# Patient Record
Sex: Female | Born: 1965 | Race: White | Hispanic: No | Marital: Single | Smoking: Current some day smoker
Health system: Southern US, Community
[De-identification: ages and names within clinical notes are randomized; demographics above are authoritative.]

## PROBLEM LIST (undated history)

## (undated) DIAGNOSIS — Z973 Presence of spectacles and contact lenses: Secondary | ICD-10-CM

## (undated) DIAGNOSIS — N9489 Other specified conditions associated with female genital organs and menstrual cycle: Secondary | ICD-10-CM

## (undated) DIAGNOSIS — I10 Essential (primary) hypertension: Secondary | ICD-10-CM

## (undated) DIAGNOSIS — T8859XA Other complications of anesthesia, initial encounter: Secondary | ICD-10-CM

## (undated) DIAGNOSIS — K5100 Ulcerative (chronic) pancolitis without complications: Secondary | ICD-10-CM

## (undated) DIAGNOSIS — K5790 Diverticulosis of intestine, part unspecified, without perforation or abscess without bleeding: Secondary | ICD-10-CM

## (undated) DIAGNOSIS — K5190 Ulcerative colitis, unspecified, without complications: Secondary | ICD-10-CM

## (undated) DIAGNOSIS — F419 Anxiety disorder, unspecified: Secondary | ICD-10-CM

## (undated) DIAGNOSIS — T7840XA Allergy, unspecified, initial encounter: Secondary | ICD-10-CM

## (undated) DIAGNOSIS — J310 Chronic rhinitis: Secondary | ICD-10-CM

## (undated) DIAGNOSIS — Z9889 Other specified postprocedural states: Secondary | ICD-10-CM

## (undated) DIAGNOSIS — R9389 Abnormal findings on diagnostic imaging of other specified body structures: Secondary | ICD-10-CM

## (undated) DIAGNOSIS — R509 Fever, unspecified: Secondary | ICD-10-CM

## (undated) DIAGNOSIS — G540 Brachial plexus disorders: Secondary | ICD-10-CM

## (undated) HISTORY — PX: BREAST BIOPSY: SHX20

## (undated) HISTORY — DX: Ulcerative colitis, unspecified, without complications: K51.90

## (undated) HISTORY — PX: FIRST RIB REMOVAL: SHX642

## (undated) HISTORY — DX: Allergy, unspecified, initial encounter: T78.40XA

## (undated) HISTORY — DX: Anxiety disorder, unspecified: F41.9

---

## 2003-03-13 ENCOUNTER — Encounter: Payer: Self-pay | Admitting: Obstetrics and Gynecology

## 2003-03-13 ENCOUNTER — Encounter: Admission: RE | Admit: 2003-03-13 | Discharge: 2003-03-13 | Payer: Self-pay | Admitting: Obstetrics and Gynecology

## 2005-10-05 ENCOUNTER — Encounter: Admission: RE | Admit: 2005-10-05 | Discharge: 2005-10-05 | Payer: Self-pay | Admitting: Obstetrics and Gynecology

## 2006-10-19 ENCOUNTER — Encounter: Admission: RE | Admit: 2006-10-19 | Discharge: 2006-10-19 | Payer: Self-pay | Admitting: Obstetrics and Gynecology

## 2008-02-19 ENCOUNTER — Emergency Department (HOSPITAL_COMMUNITY): Admission: EM | Admit: 2008-02-19 | Discharge: 2008-02-19 | Payer: Self-pay | Admitting: Emergency Medicine

## 2008-05-30 ENCOUNTER — Encounter: Admission: RE | Admit: 2008-05-30 | Discharge: 2008-05-30 | Payer: Self-pay | Admitting: Obstetrics and Gynecology

## 2008-09-03 ENCOUNTER — Ambulatory Visit: Payer: Self-pay | Admitting: Vascular Surgery

## 2008-10-11 DIAGNOSIS — Z86718 Personal history of other venous thrombosis and embolism: Secondary | ICD-10-CM

## 2008-10-11 DIAGNOSIS — G540 Brachial plexus disorders: Secondary | ICD-10-CM

## 2008-10-11 HISTORY — DX: Brachial plexus disorders: G54.0

## 2008-10-11 HISTORY — DX: Personal history of other venous thrombosis and embolism: Z86.718

## 2008-12-30 HISTORY — PX: RIB RESECTION: SHX5077

## 2009-01-23 HISTORY — PX: FIRST RIB REMOVAL: SHX642

## 2009-06-23 ENCOUNTER — Encounter: Admission: RE | Admit: 2009-06-23 | Discharge: 2009-06-23 | Payer: Self-pay | Admitting: Obstetrics and Gynecology

## 2010-07-07 ENCOUNTER — Encounter: Admission: RE | Admit: 2010-07-07 | Discharge: 2010-07-07 | Payer: Self-pay | Admitting: Obstetrics and Gynecology

## 2010-11-02 ENCOUNTER — Encounter: Payer: Self-pay | Admitting: Obstetrics and Gynecology

## 2011-02-23 NOTE — Procedures (Signed)
DUPLEX DEEP VENOUS EXAM - UPPER EXTREMITY   INDICATION:  Left arm swelling.   HISTORY:  Edema:  Five months of left arm swelling.  Trauma/Surgery:  No.  Pain:  Left arm tightness.  PE:  No.  Previous DVT:  No.  Anticoagulants:  No.  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  The thrombus extends  just proximal and distal to the clavicle.   ___________________________________________  Rosetta Posner, M.D.   MC/MEDQ  D:  09/03/2008  T:  09/03/2008  Job:  [phone]

## 2011-03-05 ENCOUNTER — Other Ambulatory Visit (HOSPITAL_COMMUNITY)
Admission: RE | Admit: 2011-03-05 | Discharge: 2011-03-05 | Disposition: A | Discharge status: Home / Self Care | Payer: BC Managed Care – PPO | Source: Ambulatory Visit | Attending: Obstetrics and Gynecology | Admitting: Obstetrics and Gynecology

## 2011-03-05 ENCOUNTER — Other Ambulatory Visit: Payer: Self-pay | Admitting: Obstetrics and Gynecology

## 2011-03-05 DIAGNOSIS — Z1231 Encounter for screening mammogram for malignant neoplasm of breast: Secondary | ICD-10-CM

## 2011-03-05 DIAGNOSIS — Z124 Encounter for screening for malignant neoplasm of cervix: Secondary | ICD-10-CM | POA: Insufficient documentation

## 2011-07-09 ENCOUNTER — Ambulatory Visit
Admission: RE | Admit: 2011-07-09 | Discharge: 2011-07-09 | Disposition: A | Discharge status: Home / Self Care | Payer: BC Managed Care – PPO | Source: Ambulatory Visit | Attending: Obstetrics and Gynecology | Admitting: Obstetrics and Gynecology

## 2011-07-09 DIAGNOSIS — Z1231 Encounter for screening mammogram for malignant neoplasm of breast: Secondary | ICD-10-CM

## 2012-09-28 ENCOUNTER — Other Ambulatory Visit: Payer: Self-pay | Admitting: Obstetrics and Gynecology

## 2012-09-28 DIAGNOSIS — Z1231 Encounter for screening mammogram for malignant neoplasm of breast: Secondary | ICD-10-CM

## 2012-10-10 ENCOUNTER — Ambulatory Visit
Admission: RE | Admit: 2012-10-10 | Discharge: 2012-10-10 | Disposition: A | Discharge status: Home / Self Care | Payer: BC Managed Care – PPO | Source: Ambulatory Visit | Attending: Obstetrics and Gynecology | Admitting: Obstetrics and Gynecology

## 2012-10-10 DIAGNOSIS — Z1231 Encounter for screening mammogram for malignant neoplasm of breast: Secondary | ICD-10-CM

## 2012-10-20 ENCOUNTER — Ambulatory Visit: Payer: BC Managed Care – PPO

## 2013-01-15 ENCOUNTER — Emergency Department (INDEPENDENT_AMBULATORY_CARE_PROVIDER_SITE_OTHER)
Admission: EM | Admit: 2013-01-15 | Discharge: 2013-01-15 | Disposition: A | Discharge status: Home / Self Care | Payer: BC Managed Care – PPO | Source: Home / Self Care | Attending: Family Medicine | Admitting: Family Medicine

## 2013-01-15 ENCOUNTER — Encounter (HOSPITAL_COMMUNITY): Payer: Self-pay | Admitting: Emergency Medicine

## 2013-01-15 DIAGNOSIS — J309 Allergic rhinitis, unspecified: Secondary | ICD-10-CM

## 2013-01-15 DIAGNOSIS — R030 Elevated blood-pressure reading, without diagnosis of hypertension: Secondary | ICD-10-CM

## 2013-01-15 HISTORY — DX: Brachial plexus disorders: G54.0

## 2013-01-15 MED ORDER — FLUTICASONE PROPIONATE 50 MCG/ACT NA SUSP: 2.0000 | Freq: Every day | NASAL | Status: DC

## 2013-01-15 NOTE — ED Provider Notes (Signed)
History     CSN: [id number]  Arrival date & time 01/15/13  1734   None     Chief Complaint  Patient presents with  . Recurrent Sinusitis    (Consider location/radiation/quality/duration/timing/severity/associated sxs/prior treatment) HPI Comments: Pt does not feel sick, no fever, chills, myalgias, rather just feels sx, mostly very congested, watery eyes, scratchy throat, clear nasal drainage. Does not have hx htn, but has been taking claritin D, otc cold medicines and using afrin nasal spray.   Patient is a 47 y.o. female presenting with URI. The history is provided by the patient.  URI Presenting symptoms: congestion, cough, ear pain, rhinorrhea and sore throat   Presenting symptoms: no fever   Severity:  Moderate Onset quality:  Gradual Duration:  5 days Timing:  Constant Progression:  Worsening Chronicity:  New Relieved by:  Nothing Worsened by:  Nothing tried Ineffective treatments:  Decongestant and OTC medications Associated symptoms: sneezing   Associated symptoms: no sinus pain, no swollen glands and no wheezing     Past Medical History  Diagnosis Date  . Thoracic outlet syndrome     History reviewed. No pertinent past surgical history.  History reviewed. No pertinent family history.  History  Substance Use Topics  . Smoking status: Never Smoker   . Smokeless tobacco: Not on file  . Alcohol Use: 0.6 oz/week    1 Glasses of wine per week     Comment: occasionally    OB History   Grav Para Term Preterm Abortions TAB SAB Ect Mult Living                  Review of Systems  Constitutional: Negative for fever and chills.  HENT: Positive for ear pain, congestion, sore throat, rhinorrhea, sneezing and postnasal drip. Negative for sinus pressure.        Ears do not hurt, but she feels ear pressure  Respiratory: Positive for cough. Negative for wheezing.     Allergies  Review of patient's allergies indicates no known allergies.  Home Medications    Current Outpatient Rx  Name  Route  Sig  Dispense  Refill  . fluticasone (FLONASE) 50 MCG/ACT nasal spray   Nasal   Place 2 sprays into the nose daily.   16 g   2     BP 183/128  Pulse 95  Temp(Src) 98.3 F (36.8 C) (Oral)  SpO2 100%  LMP 01/08/2013  Physical Exam  Constitutional: She appears well-developed and well-nourished. No distress.  HENT:  Right Ear: Tympanic membrane, external ear and ear canal normal.  Left Ear: Tympanic membrane, external ear and ear canal normal.  Nose: Mucosal edema and rhinorrhea present. Right sinus exhibits no maxillary sinus tenderness and no frontal sinus tenderness. Left sinus exhibits no maxillary sinus tenderness and no frontal sinus tenderness.  Mouth/Throat: Oropharynx is clear and moist and mucous membranes are normal.  Eyes: Right eye exhibits no discharge and no exudate. Left eye exhibits no discharge and no exudate. Right conjunctiva is not injected. Left conjunctiva is not injected.  Cardiovascular: Normal rate and regular rhythm.   Pulmonary/Chest: Effort normal and breath sounds normal.    ED Course  Procedures (including critical care time)  Labs Reviewed - No data to display No results found.   1. Allergic rhinitis   2. Elevated blood pressure reading without diagnosis of hypertension       MDM  Sx most c/w allergic rhinitis.  Rx flonase, pt to stop claritin D and take plain  claritin. Pt to stop cold medicines and monitor bp at home. F/u with pcp if bp higher than 120/80 consistently. Recheck bp at end of visit is 158/116, high most likely from otc meds use.         Carvel Getting, NP 01/15/13 1958

## 2013-01-15 NOTE — ED Notes (Signed)
Pt c/o sinus pressure with congestion, watery eyes, sneezing x 4 days. Has been taking OTC cold medicine and Claritin with mild relief. No fever. Also noticed spots on both wrist also possibly due to allergies. Pt states she has recurrent seasonal allergies. Has used nasal spray as well with mild relief. Pt is alert and oriented.

## 2013-01-17 NOTE — ED Provider Notes (Signed)
Medical screening examination/treatment/procedure(s) were performed by resident physician or non-physician practitioner and as supervising physician I was immediately available for consultation/collaboration.   Pauline Good MD.   Billy Fischer, MD 01/17/13 [phone]

## 2013-01-18 ENCOUNTER — Ambulatory Visit: Payer: BC Managed Care – PPO | Admitting: Emergency Medicine

## 2013-01-18 VITALS — BP 124/76 | HR 76 | Temp 98.2°F | Resp 16 | Ht 66.25 in | Wt 147.4 lb

## 2013-01-18 DIAGNOSIS — J209 Acute bronchitis, unspecified: Secondary | ICD-10-CM

## 2013-01-18 DIAGNOSIS — J309 Allergic rhinitis, unspecified: Secondary | ICD-10-CM

## 2013-01-18 DIAGNOSIS — J018 Other acute sinusitis: Secondary | ICD-10-CM

## 2013-01-18 MED ORDER — HYDROCOD POLST-CHLORPHEN POLST 10-8 MG/5ML PO LQCR: 5.0000 mL | Freq: Two times a day (BID) | ORAL | Status: DC | PRN

## 2013-01-18 MED ORDER — PSEUDOEPHEDRINE-GUAIFENESIN ER 60-600 MG PO TB12: 1.0000 | ORAL_TABLET | Freq: Two times a day (BID) | ORAL | Status: AC

## 2013-01-18 MED ORDER — AMOXICILLIN-POT CLAVULANATE 875-125 MG PO TABS: 1.0000 | ORAL_TABLET | Freq: Two times a day (BID) | ORAL | Status: DC

## 2013-01-18 MED ORDER — MOMETASONE FUROATE 50 MCG/ACT NA SUSP: 4.0000 | Freq: Every day | NASAL | Status: DC

## 2013-01-18 NOTE — Progress Notes (Signed)
[facility] [address], [place] [address] [phone]  Date:  01/18/2013   Name:  Jill Roman   DOB:  [birth year]   MRN:  [id number]  PCP:  No primary provider on file.    Chief Complaint: Sore Throat   History of Present Illness:  [name] is a 47 y.o. very pleasant female patient who presents with the following:  Ill for past week after doing yard work.  Started with allergic symptoms and was treated at [facility].  Now worse.  Has pressure in cheeks and purulent drainage.  Clearing throat.  Non productive cough.  No chills but fever this week at night several nights.  No wheezing or shortness of breath.  No improvement with over the counter medications or other home remedies. Denies other complaint or health concern today.   There is no problem list on file for this patient.   Past Medical History  Diagnosis Date  . Thoracic outlet syndrome   . Allergy     No past surgical history on file.  History  Substance Use Topics  . Smoking status: Never Smoker   . Smokeless tobacco: Not on file  . Alcohol Use: 0.6 oz/week    1 Glasses of wine per week     Comment: occasionally    Family History  Problem Relation Age of Onset  . Heart disease Mother   . Heart disease Father     No Known Allergies  Medication list has been reviewed and updated.  Current Outpatient Prescriptions on File Prior to Visit  Medication Sig Dispense Refill  . fluticasone (FLONASE) 50 MCG/ACT nasal spray Place 2 sprays into the nose daily.  16 g  2   No current facility-administered medications on file prior to visit.    Review of Systems:  As per HPI, otherwise negative.    Physical Examination: Filed Vitals:   01/18/13 1059  BP: 124/76  Pulse: 76  Temp: 98.2 F (36.8 C)  Resp: 16   Filed Vitals:   01/18/13 1059  Height: 5' 6.25" (1.683 m)  Weight: 147 lb 6.4 oz (66.86 kg)   Body mass index is 23.6 kg/(m^2). Ideal Body Weight: Weight in (lb) to  have BMI = 25: 155.7  GEN: WDWN, NAD, Non-toxic, A & O x 3 HEENT: Atraumatic, Normocephalic. Neck supple. No masses, No LAD. Ears and Nose: No external deformity. CV: RRR, No M/G/R. No JVD. No thrill. No extra heart sounds. PULM: CTA B, no wheezes, crackles, rhonchi. No retractions. No resp. distress. No accessory muscle use. ABD: S, NT, ND, +BS. No rebound. No HSM. EXTR: No c/c/e NEURO Normal gait.  PSYCH: Normally interactive. Conversant. Not depressed or anxious appearing.  Calm demeanor.    Assessment and Plan: Seasonal allergic rhinitis augmentin mucinex d tussionex nasonex   Signed,  Ellison Carwin, MD

## 2013-01-18 NOTE — Patient Instructions (Addendum)
Sinusitis Sinusitis is redness, soreness, and swelling (inflammation) of the paranasal sinuses. Paranasal sinuses are air pockets within the bones of your face (beneath the eyes, the middle of the forehead, or above the eyes). In healthy paranasal sinuses, mucus is able to drain out, and air is able to circulate through them by way of your nose. However, when your paranasal sinuses are inflamed, mucus and air can become trapped. This can allow bacteria and other germs to grow and cause infection. Sinusitis can develop quickly and last only a short time (acute) or continue over a long period (chronic). Sinusitis that lasts for more than 12 weeks is considered chronic.  CAUSES  Causes of sinusitis include:  Allergies.  Structural abnormalities, such as displacement of the cartilage that separates your nostrils (deviated septum), which can decrease the air flow through your nose and sinuses and affect sinus drainage.  Functional abnormalities, such as when the small hairs (cilia) that line your sinuses and help remove mucus do not work properly or are not present. SYMPTOMS  Symptoms of acute and chronic sinusitis are the same. The primary symptoms are pain and pressure around the affected sinuses. Other symptoms include:  Upper toothache.  Earache.  Headache.  Bad breath.  Decreased sense of smell and taste.  A cough, which worsens when you are lying flat.  Fatigue.  Fever.  Thick drainage from your nose, which often is green and may contain pus (purulent).  Swelling and warmth over the affected sinuses. DIAGNOSIS  Your caregiver will perform a physical exam. During the exam, your caregiver may:  Look in your nose for signs of abnormal growths in your nostrils (nasal polyps).  Tap over the affected sinus to check for signs of infection.  View the inside of your sinuses (endoscopy) with a special imaging device with a light attached (endoscope), which is inserted into your  sinuses. If your caregiver suspects that you have chronic sinusitis, one or more of the following tests may be recommended:  Allergy tests.  Nasal culture A sample of mucus is taken from your nose and sent to a lab and screened for bacteria.  Nasal cytology A sample of mucus is taken from your nose and examined by your caregiver to determine if your sinusitis is related to an allergy. TREATMENT  Most cases of acute sinusitis are related to a viral infection and will resolve on their own within 10 days. Sometimes medicines are prescribed to help relieve symptoms (pain medicine, decongestants, nasal steroid sprays, or saline sprays).  However, for sinusitis related to a bacterial infection, your caregiver will prescribe antibiotic medicines. These are medicines that will help kill the bacteria causing the infection.  Rarely, sinusitis is caused by a fungal infection. In theses cases, your caregiver will prescribe antifungal medicine. For some cases of chronic sinusitis, surgery is needed. Generally, these are cases in which sinusitis recurs more than 3 times per year, despite other treatments. HOME CARE INSTRUCTIONS   Drink plenty of water. Water helps thin the mucus so your sinuses can drain more easily.  Use a humidifier.  Inhale steam 3 to 4 times a day (for example, sit in the bathroom with the shower running).  Apply a warm, moist washcloth to your face 3 to 4 times a day, or as directed by your caregiver.  Use saline nasal sprays to help moisten and clean your sinuses.  Take over-the-counter or prescription medicines for pain, discomfort, or fever only as directed by your caregiver. [place]  CARE IF:  You have increasing pain or severe headaches.  You have nausea, vomiting, or drowsiness.  You have swelling around your face.  You have vision problems.  You have a stiff neck.  You have difficulty breathing. MAKE SURE YOU:   Understand these  instructions.  Will watch your condition.  Will get help right away if you are not doing well or get worse. Document Released: 09/27/2005 Document Revised: 12/20/2011 Document Reviewed: 10/12/2011 [facility] Patient Information 2013 [place]. Allergic Rhinitis Allergic rhinitis is when the mucous membranes in the nose respond to allergens. Allergens are particles in the air that cause your body to have an allergic reaction. This causes you to release allergic antibodies. Through a chain of events, these eventually cause you to release histamine into the blood stream (hence the use of antihistamines). Although meant to be protective to the body, it is this release that causes your discomfort, such as frequent sneezing, congestion and an itchy runny nose.  CAUSES  The pollen allergens may come from grasses, trees, and weeds. This is seasonal allergic rhinitis, or "hay fever." Other allergens cause year-round allergic rhinitis (perennial allergic rhinitis) such as house dust mite allergen, pet dander and mold spores.  SYMPTOMS   Nasal stuffiness (congestion).  Runny, itchy nose with sneezing and tearing of the eyes.  There is often an itching of the mouth, eyes and ears. It cannot be cured, but it can be controlled with medications. DIAGNOSIS  If you are unable to determine the offending allergen, skin or blood testing may find it. TREATMENT   Avoid the allergen.  Medications and allergy shots (immunotherapy) can help.  Hay fever may often be treated with antihistamines in pill or nasal spray forms. Antihistamines block the effects of histamine. There are over-the-counter medicines that may help with nasal congestion and swelling around the eyes. Check with your caregiver before taking or giving this medicine. If the treatment above does not work, there are many new medications your caregiver can prescribe. Stronger medications may be used if initial measures are ineffective.  Desensitizing injections can be used if medications and avoidance fails. Desensitization is when a patient is given ongoing shots until the body becomes less sensitive to the allergen. Make sure you follow up with your caregiver if problems continue. SEEK MEDICAL CARE IF:   You develop fever (more than 100.5 F (38.1 C).  You develop a cough that does not stop easily (persistent).  You have shortness of breath.  You start wheezing.  Symptoms interfere with normal daily activities. Document Released: 06/22/2001 Document Revised: 12/20/2011 Document Reviewed: 01/01/2009 [facility] Patient Information 2013 [place].

## 2013-02-21 ENCOUNTER — Other Ambulatory Visit: Payer: Self-pay | Admitting: Emergency Medicine

## 2013-02-21 NOTE — Telephone Encounter (Signed)
Dr Ouida Sills, do you want to RF?

## 2013-02-21 NOTE — Telephone Encounter (Signed)
No, if they need it they should be reseen

## 2013-10-08 ENCOUNTER — Other Ambulatory Visit: Payer: Self-pay

## 2013-10-08 DIAGNOSIS — Z1231 Encounter for screening mammogram for malignant neoplasm of breast: Secondary | ICD-10-CM

## 2013-12-25 ENCOUNTER — Telehealth: Payer: Self-pay

## 2013-12-25 NOTE — Telephone Encounter (Signed)
PA was denied for nasonex because pt has not tried/failed preferred generic Rhinocort. Dr Ouida Sills, do you want to Rx the Rhinocort? I have pended for 1 mos only bc pt is due for f/up next month. Please review sig.

## 2013-12-27 MED ORDER — BUDESONIDE 32 MCG/ACT NA SUSP: 2.0000 | Freq: Every day | NASAL | Status: DC

## 2013-12-27 NOTE — Telephone Encounter (Signed)
Sent Rhinocort and LM for pharmacy that sent in replacement for nasonex not covered by ins.

## 2013-12-27 NOTE — Telephone Encounter (Signed)
Just send her the rhinocort plwease

## 2014-03-29 ENCOUNTER — Other Ambulatory Visit: Payer: Self-pay | Admitting: Emergency Medicine

## 2014-09-13 ENCOUNTER — Other Ambulatory Visit: Payer: Self-pay

## 2014-09-13 DIAGNOSIS — Z1231 Encounter for screening mammogram for malignant neoplasm of breast: Secondary | ICD-10-CM

## 2014-10-03 ENCOUNTER — Ambulatory Visit
Admission: RE | Admit: 2014-10-03 | Discharge: 2014-10-03 | Disposition: A | Discharge status: Home / Self Care | Payer: BC Managed Care – PPO | Source: Ambulatory Visit

## 2014-10-03 DIAGNOSIS — Z1231 Encounter for screening mammogram for malignant neoplasm of breast: Secondary | ICD-10-CM

## 2014-10-30 ENCOUNTER — Encounter: Payer: Self-pay | Admitting: Certified Nurse Midwife

## 2014-10-30 ENCOUNTER — Ambulatory Visit (INDEPENDENT_AMBULATORY_CARE_PROVIDER_SITE_OTHER): Payer: BLUE CROSS/BLUE SHIELD | Admitting: Certified Nurse Midwife

## 2014-10-30 VITALS — BP 126/84 | HR 72 | Resp 16 | Ht 66.25 in | Wt 155.0 lb

## 2014-10-30 DIAGNOSIS — Z23 Encounter for immunization: Secondary | ICD-10-CM

## 2014-10-30 DIAGNOSIS — Z01419 Encounter for gynecological examination (general) (routine) without abnormal findings: Secondary | ICD-10-CM

## 2014-10-30 DIAGNOSIS — Z0000 Encounter for general adult medical examination without abnormal findings: Secondary | ICD-10-CM

## 2014-10-30 NOTE — Patient Instructions (Signed)

## 2014-10-30 NOTE — Progress Notes (Signed)
49 y.o.  Single  Caucasian Jill Roman here to establish gyn care and for annual exam.  Periods normal. Patient had thoracic outlet syndrome with rib removal  4 years ago and no issues. Sees Dr. Sonia Baller as PCP prn. Desires fasting labs. Periods normal, no issues. Patient has an area on vulva she would like to have looked at. Noted several months ago. Patient noted that it may be from exercise.. No other health issues.  Patient's last menstrual period was 10/23/2014.          Sexually active: No.  The current method of family planning is abstinence.    Exercising: Yes.    eliptical, tennis,spin & cardio Smoker:  no  Health Maintenance: Pap:  2013 neg, no abnormals MMG:  10-03-14 category c, birads 1 negative discussed  3 D mammogram yearly Colonoscopy:  2003 negative for blood in your stool BMD:   none TDaP:  Unsure greater than 10 years Labs: none Self breast exam: done occ   reports that she has quit smoking. She does not have any smokeless tobacco history on file. She reports that she drinks alcohol. She reports that she does not use illicit drugs.  Past Medical History  Diagnosis Date  . Thoracic outlet syndrome   . Allergy   . Anxiety     in the past    Past Surgical History  Procedure Laterality Date  . First rib removal      for thoracic outlet syndrome    Current Outpatient Prescriptions  Medication Sig Dispense Refill  . cetirizine (ZYRTEC) 10 MG tablet Take 10 mg by mouth daily.     No current facility-administered medications for this visit.    Family History  Problem Relation Age of Onset  . Heart disease Mother   . Depression Mother   . Anxiety disorder Mother   . Heart disease Father   . Hypertension Father   . Heart attack Father   . Breast cancer Maternal Grandmother   . Heart attack Maternal Grandmother     ROS:  Pertinent items are noted in HPI.  Otherwise, a comprehensive ROS was negative.  Exam:   BP 126/84 mmHg  Pulse 72  Resp 16  Ht 5' 6.25"  (1.683 m)  Wt 155 lb (70.308 kg)  BMI 24.82 kg/m2  LMP 10/23/2014 Height: 5' 6.25" (168.3 cm) Ht Readings from Last 3 Encounters:  10/30/14 5' 6.25" (1.683 m)  01/18/13 5' 6.25" (1.683 m)    General appearance: alert, cooperative and appears stated age Head: Normocephalic, without obvious abnormality, atraumatic Neck: no adenopathy, supple, symmetrical, trachea midline and thyroid normal to inspection and palpation Lungs: clear to auscultation bilaterally Breasts: normal appearance, no masses or tenderness, No nipple retraction or dimpling, No nipple discharge or bleeding, No axillary or supraclavicular adenopathy Heart: regular rate and rhythm Abdomen: soft, non-tender; no masses,  no organomegaly Extremities: extremities normal, atraumatic, no cyanosis or edema Skin: Skin color, texture, turgor normal. No rashes or lesions Lymph nodes: Cervical, supraclavicular, and axillary nodes normal. No abnormal inguinal nodes palpated Neurologic: Grossly normal   Pelvic: External genitalia:  no lesions, 2 inch long skin tag noted on mid lower mons pubic area               Urethra:  normal appearing urethra with no masses, tenderness or lesions              Bartholin's and Skene's: normal  Vagina: normal appearing vagina with normal color and discharge, no lesions              Cervix: normal, non tender, no lesions              Pap taken: Yes.   Bimanual Exam:  Uterus:  normal size, contour, position, consistency, mobility, non-tender              Adnexa: normal adnexa and no mass, fullness, tenderness               Rectovaginal: Confirms               Anus:  normal sphincter tone, no lesions  Chaperone present: Yes  A:  Well Woman with normal exam  Contraception none needed, not sexually active ever  Screening labs patient will schedule  Vulva/mons area skin tag desires removal  Immunization update  P:   Reviewed health and wellness pertinent to exam  Patient will  fast and come in for labs  Lab CMP,Lipid panel, CBC, TSH,Vit. D  Discussed agree appears to be skin tag and will discuss with other provider and advise and will be schedule  Requests TDAP  Pap smear taken today with HPVHR   counseled on breast self exam, mammography screening, adequate intake of calcium and vitamin D, diet and exercise  return annually or prn  An After Visit Summary was printed and given to the patient.

## 2014-11-01 LAB — IPS PAP TEST WITH HPV

## 2014-11-03 NOTE — Progress Notes (Signed)
Reviewed personally.  M. Suzanne Jamen Loiseau, MD.  

## 2014-11-07 ENCOUNTER — Other Ambulatory Visit (INDEPENDENT_AMBULATORY_CARE_PROVIDER_SITE_OTHER): Payer: BLUE CROSS/BLUE SHIELD

## 2014-11-07 DIAGNOSIS — Z0000 Encounter for general adult medical examination without abnormal findings: Secondary | ICD-10-CM

## 2014-11-07 LAB — CBC
HCT: 39.4 % (ref 36.0–46.0)
Hemoglobin: 13.1 g/dL (ref 12.0–15.0)
MCH: 29.5 pg (ref 26.0–34.0)
MCHC: 33.2 g/dL (ref 30.0–36.0)
MCV: 88.7 fL (ref 78.0–100.0)
MPV: 10.2 fL (ref 8.6–12.4)
Platelets: 309 10*3/uL (ref 150–400)
RBC: 4.44 MIL/uL (ref 3.87–5.11)
RDW: 14.3 % (ref 11.5–15.5)
WBC: 6.8 10*3/uL (ref 4.0–10.5)

## 2014-11-07 LAB — TSH: TSH: 0.948 u[IU]/mL (ref 0.350–4.500)

## 2014-11-07 LAB — COMPREHENSIVE METABOLIC PANEL
ALT: 21 U/L (ref 0–35)
AST: 18 U/L (ref 0–37)
Albumin: 4.1 g/dL (ref 3.5–5.2)
Alkaline Phosphatase: 43 U/L (ref 39–117)
BUN: 16 mg/dL (ref 6–23)
CO2: 26 mEq/L (ref 19–32)
Calcium: 9.1 mg/dL (ref 8.4–10.5)
Chloride: 105 mEq/L (ref 96–112)
Creat: 0.67 mg/dL (ref 0.50–1.10)
Glucose, Bld: 76 mg/dL (ref 70–99)
Potassium: 4.7 mEq/L (ref 3.5–5.3)
Sodium: 138 mEq/L (ref 135–145)
Total Bilirubin: 0.5 mg/dL (ref 0.2–1.2)
Total Protein: 6.2 g/dL (ref 6.0–8.3)

## 2014-11-07 LAB — LIPID PANEL
Cholesterol: 186 mg/dL (ref 0–200)
HDL: 77 mg/dL (ref >39)
LDL Cholesterol: 102 mg/dL — ABNORMAL HIGH (ref 0–99)
Total CHOL/HDL Ratio: 2.4 Ratio
Triglycerides: 36 mg/dL (ref <150)
VLDL: 7 mg/dL (ref 0–40)

## 2014-11-08 LAB — VITAMIN D 25 HYDROXY (VIT D DEFICIENCY, FRACTURES): Vit D, 25-Hydroxy: 18 ng/mL — ABNORMAL LOW (ref 30–100)

## 2014-11-12 ENCOUNTER — Other Ambulatory Visit: Payer: Self-pay

## 2014-11-12 MED ORDER — VITAMIN D (ERGOCALCIFEROL) 1.25 MG (50000 UNIT) PO CAPS: 50000.0000 [IU] | ORAL_CAPSULE | ORAL | Status: DC

## 2014-11-12 NOTE — Telephone Encounter (Signed)
Per lab rx sent in for vitamin d. See vitamin d labwork

## 2015-01-10 ENCOUNTER — Telehealth: Payer: Self-pay | Admitting: Certified Nurse Midwife

## 2015-01-10 NOTE — Telephone Encounter (Signed)
She will need visit with Dr Sabra Heck to assess

## 2015-01-10 NOTE — Telephone Encounter (Signed)
Spoke with patient. Patient states she has completed taking Vitamin D 50,000 IU weekly for two weeks and can not remember dosage to take now. Advised patient to begin taking Vitamin D3 800 IU daily for 2 months. Patient is agreeable. Has Vitamin D recheck scheduled for 03/14/2015. Patient also inquiring about skin tag removal with our office. Advised will speak with Regina Eck CNM regarding removal and return call with further information.  Regina Eck CNM okay for skin tag removal in office?

## 2015-01-10 NOTE — Telephone Encounter (Signed)
Spoke with patient. Advised of message as seen below from [place]. Patient is agreeable. Appointment scheduled with Dr.Miller on 4/11 at [place]. Patient is agreeable to date and time.  Cc: Dr.Miller  Routing to provider for final review. Patient agreeable to disposition. Will close encounter

## 2015-01-10 NOTE — Telephone Encounter (Signed)
Patient calling to review vitamin D instructions.

## 2015-01-20 ENCOUNTER — Other Ambulatory Visit: Payer: Self-pay | Admitting: Obstetrics & Gynecology

## 2015-01-20 ENCOUNTER — Ambulatory Visit (INDEPENDENT_AMBULATORY_CARE_PROVIDER_SITE_OTHER): Payer: BLUE CROSS/BLUE SHIELD | Admitting: Obstetrics & Gynecology

## 2015-01-20 ENCOUNTER — Ambulatory Visit: Payer: BLUE CROSS/BLUE SHIELD | Admitting: Obstetrics & Gynecology

## 2015-01-20 VITALS — BP 140/82 | HR 60 | Resp 16 | Wt 157.0 lb

## 2015-01-20 DIAGNOSIS — L918 Other hypertrophic disorders of the skin: Secondary | ICD-10-CM | POA: Diagnosis not present

## 2015-01-20 DIAGNOSIS — N9089 Other specified noninflammatory disorders of vulva and perineum: Secondary | ICD-10-CM

## 2015-01-20 NOTE — Progress Notes (Signed)
Subjective:     Patient ID: Jill Roman, female   DOB: 12-Jan-1966, 49 y.o.   MRN: [id number]  HPI Very nice 49 yo G0 SWF with typical appearing but probable skin tag on anterior aspect of vulva that she would like removed.  Saw CNM, Melvia Heaps, 10/30/14 for AEX and discussed findings and possible removal then.  Denies bleeding or itching from lesion.  It does both her, at times, due to size and she really would like it to be removed.   Review of Systems  All other systems reviewed and are negative.      Objective:   Physical Exam  Constitutional: She is oriented to person, place, and time. She appears well-developed and well-nourished.  Genitourinary: Vagina normal.    There is no rash, tenderness, lesion or injury on the right labia. There is lesion on the left labia. There is no rash, tenderness or injury on the left labia.  Lymphadenopathy:       Right: No inguinal adenopathy present.       Left: No inguinal adenopathy present.  Neurological: She is alert and oriented to person, place, and time.  Psychiatric: She has a normal mood and affect.   Procedure:  Using sterile technique, area cleansed with Betadine x 3.  0.44m 1%  Lidocaine without epi instilled beneath lesion.  Excised lesion at base using #11 blade.  Silver nitrate used for hemostasis.  Pt tolerated procedure well.  No dressing applied.    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  Pt will be called with results. Pt knows to return/follow up with any concerns about infection.

## 2015-01-22 ENCOUNTER — Encounter: Payer: Self-pay | Admitting: Obstetrics & Gynecology

## 2015-01-24 ENCOUNTER — Telehealth: Payer: Self-pay

## 2015-01-24 NOTE — Telephone Encounter (Signed)
Spoke with patient. Advised of results as seen below from [place]. Patient is agreeable and verbalizes understanding. Patient states "I feel great. I have not even felt like she did anything." Advised will let Dr.Miller know and to call if she needs anything. Patient is agreeable.  Routing to provider for final review. Patient agreeable to disposition. Will close encounter

## 2015-01-24 NOTE — Telephone Encounter (Signed)
-----   Message from Megan Salon, MD sent at 01/22/2015  8:40 AM EDT ----- Inform pt her pathology just showed an inflamed skin tag.  How is she healing?

## 2015-03-14 ENCOUNTER — Other Ambulatory Visit (INDEPENDENT_AMBULATORY_CARE_PROVIDER_SITE_OTHER): Payer: BLUE CROSS/BLUE SHIELD

## 2015-03-14 DIAGNOSIS — E559 Vitamin D deficiency, unspecified: Secondary | ICD-10-CM

## 2015-03-15 LAB — VITAMIN D 25 HYDROXY (VIT D DEFICIENCY, FRACTURES): Vit D, 25-Hydroxy: 32 ng/mL (ref 30–100)

## 2015-03-17 ENCOUNTER — Other Ambulatory Visit: Payer: Self-pay | Admitting: Certified Nurse Midwife

## 2015-03-17 DIAGNOSIS — E559 Vitamin D deficiency, unspecified: Secondary | ICD-10-CM

## 2015-09-08 ENCOUNTER — Encounter: Payer: Self-pay | Admitting: Internal Medicine

## 2015-09-09 ENCOUNTER — Other Ambulatory Visit: Payer: Self-pay

## 2015-09-09 DIAGNOSIS — Z1231 Encounter for screening mammogram for malignant neoplasm of breast: Secondary | ICD-10-CM

## 2015-09-19 ENCOUNTER — Other Ambulatory Visit (INDEPENDENT_AMBULATORY_CARE_PROVIDER_SITE_OTHER): Payer: BLUE CROSS/BLUE SHIELD

## 2015-09-19 DIAGNOSIS — E559 Vitamin D deficiency, unspecified: Secondary | ICD-10-CM

## 2015-09-20 LAB — VITAMIN D 25 HYDROXY (VIT D DEFICIENCY, FRACTURES): Vit D, 25-Hydroxy: 30 ng/mL (ref 30–100)

## 2015-10-10 ENCOUNTER — Ambulatory Visit
Admission: RE | Admit: 2015-10-10 | Discharge: 2015-10-10 | Disposition: A | Discharge status: Home / Self Care | Payer: BLUE CROSS/BLUE SHIELD | Source: Ambulatory Visit

## 2015-10-10 DIAGNOSIS — Z1231 Encounter for screening mammogram for malignant neoplasm of breast: Secondary | ICD-10-CM

## 2015-11-04 ENCOUNTER — Encounter: Payer: Self-pay | Admitting: Certified Nurse Midwife

## 2015-11-04 ENCOUNTER — Ambulatory Visit (INDEPENDENT_AMBULATORY_CARE_PROVIDER_SITE_OTHER): Payer: BLUE CROSS/BLUE SHIELD | Admitting: Certified Nurse Midwife

## 2015-11-04 VITALS — BP 122/82 | HR 70 | Resp 16 | Ht 66.25 in | Wt 157.0 lb

## 2015-11-04 DIAGNOSIS — Z0000 Encounter for general adult medical examination without abnormal findings: Secondary | ICD-10-CM

## 2015-11-04 DIAGNOSIS — Z01419 Encounter for gynecological examination (general) (routine) without abnormal findings: Secondary | ICD-10-CM | POA: Diagnosis not present

## 2015-11-04 LAB — POCT URINALYSIS DIPSTICK
Bilirubin, UA: NEGATIVE
Blood, UA: NEGATIVE
Glucose, UA: NEGATIVE
Ketones, UA: NEGATIVE
Leukocytes, UA: NEGATIVE
Nitrite, UA: NEGATIVE
Protein, UA: NEGATIVE
Urobilinogen, UA: NEGATIVE
pH, UA: 5

## 2015-11-04 NOTE — Patient Instructions (Signed)

## 2015-11-04 NOTE — Progress Notes (Signed)
50 y.o. G0P0000 Single  Caucasian Fe here for annual exam. Periods normal,no issues. Not sexually active. Screening labs today. Sees PCP prn, Dr. Sonia Baller retiring will plan with another provider. Busy with caring for father who is 73. No health issues today. Planning trip to [place]!    Patient's last menstrual period was 10/14/2015.          Sexually active: No.  The current method of family planning is abstinence.    Exercising: Yes.    cardio,weights, tennis Smoker:  no  Health Maintenance: Pap:  10-30-14 neg HPV HR neg MMG: 10-10-15 category c density,birads 1:neg Colonoscopy: 13-87yr ago ulcerative colitis BMD:   none TDaP:  2016 Shingles: no Pneumonia: no Hep C and HIV: not done Labs: poct urine-neg Self breast exam: done occ   reports that she has quit smoking. She has never used smokeless tobacco. She reports that she drinks alcohol. She reports that she does not use illicit drugs.  Past Medical History  Diagnosis Date  . Thoracic outlet syndrome   . Allergy   . Anxiety     in the past  . Ulcerative colitis ([facility]     Past Surgical History  Procedure Laterality Date  . First rib removal      for thoracic outlet syndrome    Current Outpatient Prescriptions  Medication Sig Dispense Refill  . cetirizine (ZYRTEC) 10 MG tablet Take 10 mg by mouth as needed.     . Cholecalciferol (VITAMIN D PO) Take 1,000 Int'l Units by mouth daily.      No current facility-administered medications for this visit.    Family History  Problem Relation Age of Onset  . Heart disease Mother   . Depression Mother 30    severe depression  . Anxiety disorder Mother   . Heart disease Father   . Hypertension Father   . Heart attack Father   . Breast cancer Maternal Grandmother 753 . Heart attack Maternal Grandmother     ROS:  Pertinent items are noted in HPI.  Otherwise, a comprehensive ROS was negative.  Exam:   BP 122/82 mmHg  Pulse 70  Resp 16  Ht 5' 6.25" (1.683 m)  Wt  157 lb (71.215 kg)  BMI 25.14 kg/m2  LMP 10/14/2015 Height: 5' 6.25" (168.3 cm) Ht Readings from Last 3 Encounters:  11/04/15 5' 6.25" (1.683 m)  10/30/14 5' 6.25" (1.683 m)  01/18/13 5' 6.25" (1.683 m)    General appearance: alert, cooperative and appears stated age Head: Normocephalic, without obvious abnormality, atraumatic Neck: no adenopathy, supple, symmetrical, trachea midline and thyroid normal to inspection and palpation Lungs: clear to auscultation bilaterally Breasts: normal appearance, no masses or tenderness, No nipple retraction or dimpling, No nipple discharge or bleeding, No axillary or supraclavicular adenopathy Heart: regular rate and rhythm Abdomen: soft, non-tender; no masses,  no organomegaly Extremities: extremities normal, atraumatic, no cyanosis or edema Skin: Skin color, texture, turgor normal. No rashes or lesions Lymph nodes: Cervical, supraclavicular, and axillary nodes normal. No abnormal inguinal nodes palpated Neurologic: Grossly normal   Pelvic: External genitalia:  no lesions              Urethra:  normal appearing urethra with no masses, tenderness or lesions              Bartholin's and Skene's: normal                 Vagina: normal appearing vagina with pale color and scant discharge, no  lesions              Cervix: no cervical motion tenderness, no lesions and normal              Pap taken: No. Bimanual Exam:  Uterus:  normal size, contour, position, consistency, mobility, non-tender              Adnexa: normal adnexa and no mass, fullness, tenderness               Rectovaginal: Confirms               Anus:  normal sphincter tone, no lesions  Chaperone present: yes  A:  Well Woman with normal exam  Contraception none needed  Schedule fasting screening labs  Colonoscopy due  P:   Reviewed health and wellness pertinent to exam  Patient will schedule for fasting labs  Discussed risks/benefits of colonoscopy, patient will call when ready to  schedule this year with Dr. Collene Mares.  Pap smear as above not taken   counseled on breast self exam, mammography screening, adequate intake of calcium and vitamin D, diet and exercise  return annually or prn  An After Visit Summary was printed and given to the patient.

## 2015-11-07 NOTE — Progress Notes (Signed)
Reviewed personally.  M. Suzanne Jerrell Mangel, MD.  

## 2015-11-13 ENCOUNTER — Other Ambulatory Visit (INDEPENDENT_AMBULATORY_CARE_PROVIDER_SITE_OTHER): Payer: BLUE CROSS/BLUE SHIELD

## 2015-11-13 ENCOUNTER — Other Ambulatory Visit: Payer: BLUE CROSS/BLUE SHIELD

## 2015-11-13 DIAGNOSIS — Z0000 Encounter for general adult medical examination without abnormal findings: Secondary | ICD-10-CM

## 2015-11-13 LAB — COMPREHENSIVE METABOLIC PANEL
ALT: 9 U/L (ref 6–29)
AST: 13 U/L (ref 10–35)
Albumin: 4.3 g/dL (ref 3.6–5.1)
Alkaline Phosphatase: 50 U/L (ref 33–130)
BUN: 18 mg/dL (ref 7–25)
CO2: 28 mmol/L (ref 20–31)
Calcium: 9.4 mg/dL (ref 8.6–10.4)
Chloride: 107 mmol/L (ref 98–110)
Creat: 0.77 mg/dL (ref 0.50–1.05)
Glucose, Bld: 76 mg/dL (ref 65–99)
Potassium: 4.6 mmol/L (ref 3.5–5.3)
Sodium: 141 mmol/L (ref 135–146)
Total Bilirubin: 0.5 mg/dL (ref 0.2–1.2)
Total Protein: 6.6 g/dL (ref 6.1–8.1)

## 2015-11-13 LAB — CBC
HCT: 41 % (ref 36.0–46.0)
Hemoglobin: 14 g/dL (ref 12.0–15.0)
MCH: 30.2 pg (ref 26.0–34.0)
MCHC: 34.1 g/dL (ref 30.0–36.0)
MCV: 88.4 fL (ref 78.0–100.0)
MPV: 9.9 fL (ref 8.6–12.4)
Platelets: 304 10*3/uL (ref 150–400)
RBC: 4.64 MIL/uL (ref 3.87–5.11)
RDW: 13.9 % (ref 11.5–15.5)
WBC: 5.8 10*3/uL (ref 4.0–10.5)

## 2015-11-13 LAB — HIV ANTIBODY (ROUTINE TESTING W REFLEX): HIV 1&2 Ab, 4th Generation: NONREACTIVE

## 2015-11-13 LAB — LIPID PANEL
Cholesterol: 182 mg/dL (ref 125–200)
HDL: 72 mg/dL (ref >=46)
LDL Cholesterol: 101 mg/dL (ref <130)
Total CHOL/HDL Ratio: 2.5 Ratio (ref <=5.0)
Triglycerides: 46 mg/dL (ref <150)
VLDL: 9 mg/dL (ref <30)

## 2015-11-13 LAB — VITAMIN D 25 HYDROXY (VIT D DEFICIENCY, FRACTURES): Vit D, 25-Hydroxy: 28 ng/mL — ABNORMAL LOW (ref 30–100)

## 2015-11-13 LAB — HEPATITIS C ANTIBODY: HCV Ab: NEGATIVE

## 2015-11-13 LAB — TSH: TSH: 0.708 u[IU]/mL (ref 0.350–4.500)

## 2015-11-14 NOTE — Progress Notes (Signed)
Hold to make sure seen on mychart

## 2016-02-21 DIAGNOSIS — S8991XA Unspecified injury of right lower leg, initial encounter: Secondary | ICD-10-CM | POA: Diagnosis not present

## 2016-02-21 DIAGNOSIS — W19XXXA Unspecified fall, initial encounter: Secondary | ICD-10-CM | POA: Diagnosis not present

## 2016-02-21 DIAGNOSIS — Y92099 Unspecified place in other non-institutional residence as the place of occurrence of the external cause: Secondary | ICD-10-CM | POA: Diagnosis not present

## 2016-02-21 DIAGNOSIS — T148 Other injury of unspecified body region: Secondary | ICD-10-CM | POA: Diagnosis not present

## 2016-02-21 DIAGNOSIS — M25561 Pain in right knee: Secondary | ICD-10-CM | POA: Diagnosis not present

## 2016-09-07 ENCOUNTER — Other Ambulatory Visit: Payer: Self-pay | Admitting: Certified Nurse Midwife

## 2016-09-07 DIAGNOSIS — Z1231 Encounter for screening mammogram for malignant neoplasm of breast: Secondary | ICD-10-CM

## 2016-10-12 ENCOUNTER — Ambulatory Visit
Admission: RE | Admit: 2016-10-12 | Discharge: 2016-10-12 | Disposition: A | Discharge status: Home / Self Care | Payer: BLUE CROSS/BLUE SHIELD | Source: Ambulatory Visit | Attending: Certified Nurse Midwife | Admitting: Certified Nurse Midwife

## 2016-10-12 DIAGNOSIS — Z1231 Encounter for screening mammogram for malignant neoplasm of breast: Secondary | ICD-10-CM | POA: Diagnosis not present

## 2016-11-04 ENCOUNTER — Ambulatory Visit: Payer: BLUE CROSS/BLUE SHIELD | Admitting: Certified Nurse Midwife

## 2017-08-03 ENCOUNTER — Other Ambulatory Visit (HOSPITAL_COMMUNITY)
Admission: RE | Admit: 2017-08-03 | Discharge: 2017-08-03 | Disposition: A | Discharge status: Home / Self Care | Payer: BLUE CROSS/BLUE SHIELD | Source: Ambulatory Visit | Attending: Certified Nurse Midwife | Admitting: Certified Nurse Midwife

## 2017-08-03 ENCOUNTER — Ambulatory Visit (INDEPENDENT_AMBULATORY_CARE_PROVIDER_SITE_OTHER): Payer: BLUE CROSS/BLUE SHIELD | Admitting: Certified Nurse Midwife

## 2017-08-03 ENCOUNTER — Encounter: Payer: Self-pay | Admitting: Certified Nurse Midwife

## 2017-08-03 VITALS — BP 140/84 | HR 68 | Resp 16 | Ht 66.0 in | Wt 154.0 lb

## 2017-08-03 DIAGNOSIS — Z1211 Encounter for screening for malignant neoplasm of colon: Secondary | ICD-10-CM | POA: Diagnosis not present

## 2017-08-03 DIAGNOSIS — Z01419 Encounter for gynecological examination (general) (routine) without abnormal findings: Secondary | ICD-10-CM | POA: Diagnosis not present

## 2017-08-03 DIAGNOSIS — Z124 Encounter for screening for malignant neoplasm of cervix: Secondary | ICD-10-CM | POA: Diagnosis not present

## 2017-08-03 DIAGNOSIS — Z0000 Encounter for general adult medical examination without abnormal findings: Secondary | ICD-10-CM | POA: Insufficient documentation

## 2017-08-03 NOTE — Progress Notes (Signed)
51 y.o. G0P0000 Single  Caucasian Fe here for annual exam. Perimenopausal with irregular cycles this year. Periods lighter and some just spotting every 3-4 month and last period in October was a normal slightly heavier period. Occasional hot flash, no issues. Some anxiety changes, but exercise controls.  Moved this year with Father now living together, with adequate space for both. Was seeing Dr. Sonia Baller who retired. Has not established PCP yet. Needs Vitamin D recheck today. No other health issues today. Needs colonoscopy due to history of ulcerative colitis some flares at times. Happy to be moved!  Patient's last menstrual period was 07/14/2017 (exact date).          Sexually active: No.  The current method of family planning is abstinence.    Exercising: Yes.    tennis & walking Smoker:  no  Health Maintenance: Pap:  10-30-14 neg HPV HR neg History of Abnormal Pap: no MMG:  10-12-16 category c density birads 1:neg Self Breast exams: occ Colonoscopy:  14-15 yrs ago ulcerative colitis BMD:   none TDaP:  2016 Shingles: no Pneumonia: no Hep C and HIV: both neg 2017 Labs: if needed.   reports that she has quit smoking. She has never used smokeless tobacco. She reports that she drinks alcohol. She reports that she does not use drugs.  Past Medical History:  Diagnosis Date  . Allergy   . Anxiety    in the past  . Thoracic outlet syndrome   . Ulcerative colitis [facility])     Past Surgical History:  Procedure Laterality Date  . FIRST RIB REMOVAL     for thoracic outlet syndrome    Current Outpatient Prescriptions  Medication Sig Dispense Refill  . Ascorbic Acid (VITAMIN C PO) Take by mouth.    . cetirizine (ZYRTEC) 10 MG tablet Take 10 mg by mouth as needed.     . Cholecalciferol (VITAMIN D PO) Take 1,000 Int'l Units by mouth daily.     . Pseudoephedrine HCl (SUDAFED PO) Take by mouth as needed.     No current facility-administered medications for this visit.     Family History   Problem Relation Age of Onset  . Heart disease Mother   . Anxiety disorder Mother   . Depression Mother        severe depression  . Heart disease Father        chf  . Hypertension Father   . Heart attack Father   . Breast cancer Maternal Grandmother        age 68's  . Heart attack Maternal Grandmother     ROS:  Pertinent items are noted in HPI.  Otherwise, a comprehensive ROS was negative.  Exam:   BP 140/84   Pulse 68   Resp 16   Ht 5' 6"  (1.676 m)   Wt 154 lb (69.9 kg)   LMP 07/14/2017 (Exact Date)   BMI 24.86 kg/m  Height: 5' 6"  (167.6 cm) Ht Readings from Last 3 Encounters:  08/03/17 5' 6"  (1.676 m)  11/04/15 5' 6.25" (1.683 m)  10/30/14 5' 6.25" (1.683 m)    General appearance: alert, cooperative and appears stated age Head: Normocephalic, without obvious abnormality, atraumatic Neck: no adenopathy, supple, symmetrical, trachea midline and thyroid normal to inspection and palpation Lungs: clear to auscultation bilaterally Breasts: normal appearance, no masses or tenderness, No nipple retraction or dimpling, No nipple discharge or bleeding, No axillary or supraclavicular adenopathy Heart: regular rate and rhythm Abdomen: soft, non-tender; no masses,  no  organomegaly Extremities: extremities normal, atraumatic, no cyanosis or edema Skin: Skin color, texture, turgor normal. No rashes or lesions Lymph nodes: Cervical, supraclavicular, and axillary nodes normal. No abnormal inguinal nodes palpated Neurologic: Grossly normal   Pelvic: External genitalia:  no lesions              Urethra:  normal appearing urethra with no masses, tenderness or lesions              Bartholin's and Skene's: normal                 Vagina: normal appearing vagina with normal color and discharge, no lesions              Cervix: no cervical motion tenderness, no lesions and nulliparous appearance              Pap taken: Yes.   Bimanual Exam:  Uterus:  normal size, contour, position,  consistency, mobility, non-tender              Adnexa: normal adnexa and no mass, fullness, tenderness               Rectovaginal: Confirms               Anus:  normal sphincter tone, no lesions  Chaperone present: yes  A:  Well Woman with normal exam  Perimenopausal with cycle changes  History of vitamin D deficiency  History of ulcerative colitis colonoscopy due  Screening labs  P:   Reviewed health and wellness pertinent to exam  Discussed etiology of perimenopausal and menopause and etiology. Discussed needs to advise if no period in 3 month period. Discussed importance to prevent heavy bleeding occurrence or increase of uterine dysplasia with no menses. Patient voiced understanding. Will do Harper today.  Lab: Vitamin D,FSH  Discussed risks and benefits of colonoscopy, desires referral. Patient will be called with appointment information. Questions addressed.  Labs: CMP, Lipid panel, TSH,   Pap smear: yes   counseled on breast self exam, mammography screening, adequate intake of calcium and vitamin D, diet and exercise  return annually or prn  An After Visit Summary was printed and given to the patient.

## 2017-08-03 NOTE — Patient Instructions (Signed)

## 2017-08-04 ENCOUNTER — Other Ambulatory Visit: Payer: Self-pay | Admitting: Certified Nurse Midwife

## 2017-08-04 DIAGNOSIS — E559 Vitamin D deficiency, unspecified: Secondary | ICD-10-CM

## 2017-08-04 LAB — COMPREHENSIVE METABOLIC PANEL
ALT: 13 IU/L (ref 0–32)
AST: 20 IU/L (ref 0–40)
Albumin/Globulin Ratio: 2 (ref 1.2–2.2)
Albumin: 4.7 g/dL (ref 3.5–5.5)
Alkaline Phosphatase: 55 IU/L (ref 39–117)
BUN/Creatinine Ratio: 10 (ref 9–23)
BUN: 7 mg/dL (ref 6–24)
Bilirubin Total: 0.4 mg/dL (ref 0.0–1.2)
CO2: 23 mmol/L (ref 20–29)
Calcium: 9.7 mg/dL (ref 8.7–10.2)
Chloride: 101 mmol/L (ref 96–106)
Creatinine, Ser: 0.67 mg/dL (ref 0.57–1.00)
GFR calc Af Amer: 117 mL/min/{1.73_m2} (ref >59)
GFR calc non Af Amer: 101 mL/min/{1.73_m2} (ref >59)
Globulin, Total: 2.4 g/dL (ref 1.5–4.5)
Glucose: 75 mg/dL (ref 65–99)
Potassium: 4.4 mmol/L (ref 3.5–5.2)
Sodium: 142 mmol/L (ref 134–144)
Total Protein: 7.1 g/dL (ref 6.0–8.5)

## 2017-08-04 LAB — LIPID PANEL
Chol/HDL Ratio: 2.2 ratio (ref 0.0–4.4)
Cholesterol, Total: 194 mg/dL (ref 100–199)
HDL: 88 mg/dL (ref >39)
LDL Calculated: 95 mg/dL (ref 0–99)
Triglycerides: 56 mg/dL (ref 0–149)
VLDL Cholesterol Cal: 11 mg/dL (ref 5–40)

## 2017-08-04 LAB — FOLLICLE STIMULATING HORMONE: FSH: 6 m[IU]/mL

## 2017-08-04 LAB — CYTOLOGY - PAP
Diagnosis: NEGATIVE
HPV: NOT DETECTED

## 2017-08-04 LAB — VITAMIN D 25 HYDROXY (VIT D DEFICIENCY, FRACTURES): Vit D, 25-Hydroxy: 29.9 ng/mL — ABNORMAL LOW (ref 30.0–100.0)

## 2017-08-04 LAB — TSH: TSH: 1.48 u[IU]/mL (ref 0.450–4.500)

## 2017-08-05 ENCOUNTER — Telehealth: Payer: Self-pay | Admitting: Certified Nurse Midwife

## 2017-08-05 NOTE — Telephone Encounter (Signed)
Left voicemail regarding referral appointment. The information is listed below. Should the patient need to cancel or reschedule this appointment, Please advise them to call the office they've been referred to in order to reschedule.  [facility] [address] [place], [place]  Phone: [phone]  Dr. Collene Mares 08/09/17 @ 10:30 am. Please arrive 15 minutes early and bring your insurance card and photo id and list of medications.

## 2017-08-18 ENCOUNTER — Other Ambulatory Visit: Payer: Self-pay | Admitting: Gastroenterology

## 2017-08-18 ENCOUNTER — Ambulatory Visit
Admission: RE | Admit: 2017-08-18 | Discharge: 2017-08-18 | Disposition: A | Discharge status: Home / Self Care | Payer: BLUE CROSS/BLUE SHIELD | Source: Ambulatory Visit | Attending: Gastroenterology | Admitting: Gastroenterology

## 2017-08-18 DIAGNOSIS — T189XXA Foreign body of alimentary tract, part unspecified, initial encounter: Secondary | ICD-10-CM | POA: Diagnosis not present

## 2017-08-18 DIAGNOSIS — Z1211 Encounter for screening for malignant neoplasm of colon: Secondary | ICD-10-CM | POA: Diagnosis not present

## 2017-08-18 DIAGNOSIS — K5190 Ulcerative colitis, unspecified, without complications: Secondary | ICD-10-CM | POA: Diagnosis not present

## 2017-09-10 DIAGNOSIS — K5730 Diverticulosis of large intestine without perforation or abscess without bleeding: Secondary | ICD-10-CM | POA: Diagnosis not present

## 2017-09-10 DIAGNOSIS — Z1211 Encounter for screening for malignant neoplasm of colon: Secondary | ICD-10-CM | POA: Diagnosis not present

## 2017-09-16 ENCOUNTER — Other Ambulatory Visit: Payer: Self-pay | Admitting: Certified Nurse Midwife

## 2017-09-16 DIAGNOSIS — Z1231 Encounter for screening mammogram for malignant neoplasm of breast: Secondary | ICD-10-CM

## 2017-10-06 ENCOUNTER — Other Ambulatory Visit: Payer: Self-pay | Admitting: Certified Nurse Midwife

## 2017-10-06 ENCOUNTER — Other Ambulatory Visit (INDEPENDENT_AMBULATORY_CARE_PROVIDER_SITE_OTHER): Payer: BLUE CROSS/BLUE SHIELD

## 2017-10-06 DIAGNOSIS — E559 Vitamin D deficiency, unspecified: Secondary | ICD-10-CM | POA: Diagnosis not present

## 2017-10-07 LAB — VITAMIN D 25 HYDROXY (VIT D DEFICIENCY, FRACTURES): Vit D, 25-Hydroxy: 27.2 ng/mL — ABNORMAL LOW (ref 30.0–100.0)

## 2017-10-10 LAB — VITAMIN D 25 HYDROXY (VIT D DEFICIENCY, FRACTURES)

## 2017-10-21 ENCOUNTER — Ambulatory Visit: Payer: BLUE CROSS/BLUE SHIELD

## 2017-11-09 ENCOUNTER — Ambulatory Visit
Admission: RE | Admit: 2017-11-09 | Discharge: 2017-11-09 | Disposition: A | Discharge status: Home / Self Care | Payer: Self-pay | Source: Ambulatory Visit | Attending: Certified Nurse Midwife | Admitting: Certified Nurse Midwife

## 2017-11-09 DIAGNOSIS — Z1231 Encounter for screening mammogram for malignant neoplasm of breast: Secondary | ICD-10-CM | POA: Diagnosis not present

## 2018-01-10 ENCOUNTER — Other Ambulatory Visit: Payer: BLUE CROSS/BLUE SHIELD

## 2018-01-13 ENCOUNTER — Telehealth: Payer: Self-pay | Admitting: Certified Nurse Midwife

## 2018-01-13 ENCOUNTER — Other Ambulatory Visit: Payer: BLUE CROSS/BLUE SHIELD

## 2018-01-13 DIAGNOSIS — E559 Vitamin D deficiency, unspecified: Secondary | ICD-10-CM

## 2018-01-13 NOTE — Telephone Encounter (Signed)
Patient had inactive insurance coverage, will call back to reschedule her labs.

## 2018-01-14 LAB — VITAMIN D 25 HYDROXY (VIT D DEFICIENCY, FRACTURES): Vit D, 25-Hydroxy: 28.9 ng/mL — ABNORMAL LOW (ref 30.0–100.0)

## 2018-01-24 DIAGNOSIS — K51011 Ulcerative (chronic) pancolitis with rectal bleeding: Secondary | ICD-10-CM | POA: Diagnosis not present

## 2018-01-24 DIAGNOSIS — R194 Change in bowel habit: Secondary | ICD-10-CM | POA: Diagnosis not present

## 2018-01-26 NOTE — Telephone Encounter (Signed)
Spoke with patient, calling for 01/13/18 Vit D lab results. Advised as seen below per Melvia Heaps, CNM. Patient agreeable to plan.   Patient states she was just recently this week Dx with ulcerative colitis, asking if this can decrease or be effecting Vit D absorption?   Advised will review with Melvia Heaps, CNM when she returns to the office on 4/23 and return call. Patient states she also plans to discuss with Dr. Collene Mares.   Melvia Heaps, CNM -please review and advise?   Notes recorded by Regina Eck, CNM on 01/16/2018 at 7:38 AM EDT Hi Jill Roman, Your vitamin D is slowly improving. Continue on 2000 IU Vitamin D 3 daily . I have looked at your profile for the last 2 years and it has bordeline have been low, so you just need to stay on this dosage to maintain.. Will recheck at at next annual exam. Have a great day Jill Roman

## 2018-01-26 NOTE — Telephone Encounter (Signed)
Patient is calling for her recent lab results.

## 2018-01-30 NOTE — Telephone Encounter (Signed)
GI issues can change absorption, but she can also work on in diet which is also recommended. I agree with checking with Dr. Collene Mares also. Good Vitamin D sources are eggs, Vitamin D fortified bread and cereal. Fatty fish, sweet potatoes just a few. She can google Vitamin d sources for others that might work for her also.

## 2018-01-31 NOTE — Telephone Encounter (Signed)
Left detailed message, ok per dpr. Advised as seen below per Melvia Heaps, CNM. Return call to office with any additional questions. Will close encounter.

## 2018-02-09 DIAGNOSIS — Z800 Family history of malignant neoplasm of digestive organs: Secondary | ICD-10-CM | POA: Diagnosis not present

## 2018-02-09 DIAGNOSIS — K5190 Ulcerative colitis, unspecified, without complications: Secondary | ICD-10-CM | POA: Diagnosis not present

## 2018-02-09 DIAGNOSIS — K5730 Diverticulosis of large intestine without perforation or abscess without bleeding: Secondary | ICD-10-CM | POA: Diagnosis not present

## 2018-08-11 ENCOUNTER — Ambulatory Visit: Payer: BLUE CROSS/BLUE SHIELD | Admitting: Certified Nurse Midwife

## 2018-09-06 ENCOUNTER — Other Ambulatory Visit: Payer: Self-pay

## 2018-09-06 ENCOUNTER — Other Ambulatory Visit (HOSPITAL_COMMUNITY)
Admission: RE | Admit: 2018-09-06 | Discharge: 2018-09-06 | Disposition: A | Discharge status: Home / Self Care | Payer: BLUE CROSS/BLUE SHIELD | Source: Ambulatory Visit | Attending: Certified Nurse Midwife | Admitting: Certified Nurse Midwife

## 2018-09-06 ENCOUNTER — Ambulatory Visit (INDEPENDENT_AMBULATORY_CARE_PROVIDER_SITE_OTHER): Payer: BLUE CROSS/BLUE SHIELD | Admitting: Certified Nurse Midwife

## 2018-09-06 ENCOUNTER — Encounter: Payer: Self-pay | Admitting: Certified Nurse Midwife

## 2018-09-06 VITALS — BP 110/70 | HR 68 | Resp 16 | Ht 65.75 in | Wt 164.0 lb

## 2018-09-06 DIAGNOSIS — E559 Vitamin D deficiency, unspecified: Secondary | ICD-10-CM

## 2018-09-06 DIAGNOSIS — N926 Irregular menstruation, unspecified: Secondary | ICD-10-CM | POA: Diagnosis not present

## 2018-09-06 DIAGNOSIS — Z01419 Encounter for gynecological examination (general) (routine) without abnormal findings: Secondary | ICD-10-CM | POA: Diagnosis not present

## 2018-09-06 DIAGNOSIS — Z0000 Encounter for general adult medical examination without abnormal findings: Secondary | ICD-10-CM | POA: Diagnosis not present

## 2018-09-06 DIAGNOSIS — R635 Abnormal weight gain: Secondary | ICD-10-CM | POA: Diagnosis not present

## 2018-09-06 DIAGNOSIS — Z124 Encounter for screening for malignant neoplasm of cervix: Secondary | ICD-10-CM | POA: Diagnosis not present

## 2018-09-06 NOTE — Progress Notes (Signed)
52 y.o. G0P0000 Single  Caucasian Fe here for annual exam. Periods changing now to every 2-3 months, with last one long heavy period for 7 days. Now spotting over the past day with brown/red and feels like period may start. Denies hot flashes, night sweats at this point. Has been on Lialda for Ulcerative colitis with good results now.  Not sexually active. Sees urgent care if needed. No other health issues today  No LMP recorded.          Sexually active: No.  The current method of family planning is abstinence.    Exercising: Yes.    walk & tennis Smoker:  no  Review of Systems  Constitutional: Negative.   HENT: Negative.   Eyes: Negative.   Respiratory: Negative.   Cardiovascular: Negative.   Gastrointestinal: Negative.   Genitourinary:       Menstrual cycle changes  Musculoskeletal: Negative.   Skin: Negative.   Neurological: Negative.   Endo/Heme/Allergies: Negative.   Psychiatric/Behavioral: Negative.     Health Maintenance: Pap:  10-30-14 neg HPV HR neg, 08-03-17 neg HPV HR Neg History of Abnormal Pap: no MMG:  11-09-17 category c density birads 1:neg Self Breast exams: no Colonoscopy: 2018 f/u 62yr BMD:   none TDaP:  2016 Shingles: no Pneumonia: no Hep C and HIV: both neg 2017 Labs: if needed   reports that she has quit smoking. She has never used smokeless tobacco. She reports that she drinks alcohol. She reports that she does not use drugs.  Past Medical History:  Diagnosis Date  . Allergy   . Anxiety    in the past  . Thoracic outlet syndrome   . Ulcerative colitis ([facility]     Past Surgical History:  Procedure Laterality Date  . FIRST RIB REMOVAL     for thoracic outlet syndrome    Current Outpatient Medications  Medication Sig Dispense Refill  . Ascorbic Acid (VITAMIN C PO) Take by mouth.    . cetirizine (ZYRTEC) 10 MG tablet Take 10 mg by mouth as needed.     . Cholecalciferol (VITAMIN D PO) Take 1,000 Int'l Units by mouth daily.     .  Pseudoephedrine HCl (SUDAFED PO) Take by mouth as needed.     No current facility-administered medications for this visit.     Family History  Problem Relation Age of Onset  . Heart disease Mother   . Anxiety disorder Mother   . Depression Mother        severe depression  . Heart disease Father        chf  . Hypertension Father   . Heart attack Father   . Breast cancer Maternal Grandmother        age 53's . Heart attack Maternal Grandmother     ROS:  Pertinent items are noted in HPI.  Otherwise, a comprehensive ROS was negative.  Exam:   There were no vitals taken for this visit.   Ht Readings from Last 3 Encounters:  08/03/17 5' 6"  (1.676 m)  11/04/15 5' 6.25" (1.683 m)  10/30/14 5' 6.25" (1.683 m)    General appearance: alert, cooperative and appears stated age Head: Normocephalic, without obvious abnormality, atraumatic Neck: no adenopathy, supple, symmetrical, trachea midline and thyroid normal to inspection and palpation Lungs: clear to auscultation bilaterally Breasts: normal appearance, no masses or tenderness, No nipple retraction or dimpling, No nipple discharge or bleeding, No axillary or supraclavicular adenopathy Heart: regular rate and rhythm Abdomen: soft, non-tender; no masses,  no organomegaly Extremities: extremities normal, atraumatic, no cyanosis or edema Skin: Skin color, texture, turgor normal. No rashes or lesions Lymph nodes: Cervical, supraclavicular, and axillary nodes normal. No abnormal inguinal nodes palpated Neurologic: Grossly normal   Pelvic: External genitalia:  no lesions              Urethra:  normal appearing urethra with no masses, tenderness or lesions              Bartholin's and Skene's: normal                 Vagina: normal appearing vagina with normal color and discharge, no lesions, blood noted in vagina              Cervix: no cervical motion tenderness, no lesions and nulliparous appearance ,blood noted from cervix              Pap taken: Yes.   Bimanual Exam:  Uterus:  normal size, contour, position, consistency, mobility, non-tender and anteverted              Adnexa: normal adnexa and no mass, fullness, tenderness               Rectovaginal: Confirms               Anus:  normal sphincter tone, no lesions  Chaperone present: yes  A:  Well Woman with normal exam  Contraception not sexually active  Menstrual change suspect perimenopause  Ulcerative colitis flare with GI management, resolving  Screening labs  P:   Reviewed health and wellness pertinent to exam  Discussed perimenopause/menopause etiology and bleeding expectations. Discussed very important to keep menses calendar and advise if no period in 3 months to help avoid excessive bleeding or concerns with hyperplasia due no period. Patient given menses calendar and agreeable. Questions addressed.  Continue GI follow as indicated  Labs: Lipid panel, CMP, FSH, TSH, Vitamin D  Pap smear: no   counseled on breast self exam, mammography screening, feminine hygiene, menopause, adequate intake of calcium and vitamin D, diet and exercise  return annually or prn  An After Visit Summary was printed and given to the patient.

## 2018-09-07 LAB — COMPREHENSIVE METABOLIC PANEL
ALT: 16 IU/L (ref 0–32)
AST: 20 IU/L (ref 0–40)
Albumin/Globulin Ratio: 2.1 (ref 1.2–2.2)
Albumin: 4.5 g/dL (ref 3.5–5.5)
Alkaline Phosphatase: 53 IU/L (ref 39–117)
BUN/Creatinine Ratio: 18 (ref 9–23)
BUN: 14 mg/dL (ref 6–24)
Bilirubin Total: 0.4 mg/dL (ref 0.0–1.2)
CO2: 24 mmol/L (ref 20–29)
Calcium: 9.6 mg/dL (ref 8.7–10.2)
Chloride: 101 mmol/L (ref 96–106)
Creatinine, Ser: 0.8 mg/dL (ref 0.57–1.00)
GFR calc Af Amer: 97 mL/min/{1.73_m2} (ref >59)
GFR calc non Af Amer: 84 mL/min/{1.73_m2} (ref >59)
Globulin, Total: 2.1 g/dL (ref 1.5–4.5)
Glucose: 76 mg/dL (ref 65–99)
Potassium: 4.6 mmol/L (ref 3.5–5.2)
Sodium: 141 mmol/L (ref 134–144)
Total Protein: 6.6 g/dL (ref 6.0–8.5)

## 2018-09-07 LAB — VITAMIN D 25 HYDROXY (VIT D DEFICIENCY, FRACTURES): Vit D, 25-Hydroxy: 31.5 ng/mL (ref 30.0–100.0)

## 2018-09-07 LAB — LIPID PANEL
Chol/HDL Ratio: 2.7 ratio (ref 0.0–4.4)
Cholesterol, Total: 207 mg/dL — ABNORMAL HIGH (ref 100–199)
HDL: 76 mg/dL (ref >39)
LDL Calculated: 118 mg/dL — ABNORMAL HIGH (ref 0–99)
Triglycerides: 63 mg/dL (ref 0–149)
VLDL Cholesterol Cal: 13 mg/dL (ref 5–40)

## 2018-09-07 LAB — FOLLICLE STIMULATING HORMONE: FSH: 26.3 m[IU]/mL

## 2018-09-07 LAB — TSH: TSH: 1.89 u[IU]/mL (ref 0.450–4.500)

## 2018-09-11 LAB — CYTOLOGY - PAP: Diagnosis: NEGATIVE

## 2018-10-17 ENCOUNTER — Other Ambulatory Visit: Payer: Self-pay | Admitting: Certified Nurse Midwife

## 2018-10-17 DIAGNOSIS — Z1231 Encounter for screening mammogram for malignant neoplasm of breast: Secondary | ICD-10-CM

## 2018-11-14 ENCOUNTER — Ambulatory Visit
Admission: RE | Admit: 2018-11-14 | Discharge: 2018-11-14 | Disposition: A | Discharge status: Home / Self Care | Payer: BLUE CROSS/BLUE SHIELD | Source: Ambulatory Visit | Attending: Certified Nurse Midwife | Admitting: Certified Nurse Midwife

## 2018-11-14 ENCOUNTER — Ambulatory Visit: Payer: No Typology Code available for payment source

## 2018-11-14 DIAGNOSIS — Z1231 Encounter for screening mammogram for malignant neoplasm of breast: Secondary | ICD-10-CM

## 2018-12-06 DIAGNOSIS — Z23 Encounter for immunization: Secondary | ICD-10-CM | POA: Diagnosis not present

## 2018-12-06 DIAGNOSIS — B078 Other viral warts: Secondary | ICD-10-CM | POA: Diagnosis not present

## 2018-12-06 DIAGNOSIS — L821 Other seborrheic keratosis: Secondary | ICD-10-CM | POA: Diagnosis not present

## 2018-12-06 DIAGNOSIS — L820 Inflamed seborrheic keratosis: Secondary | ICD-10-CM | POA: Diagnosis not present

## 2019-04-17 DIAGNOSIS — R194 Change in bowel habit: Secondary | ICD-10-CM | POA: Diagnosis not present

## 2019-04-17 DIAGNOSIS — K5730 Diverticulosis of large intestine without perforation or abscess without bleeding: Secondary | ICD-10-CM | POA: Diagnosis not present

## 2019-04-17 DIAGNOSIS — K51011 Ulcerative (chronic) pancolitis with rectal bleeding: Secondary | ICD-10-CM | POA: Diagnosis not present

## 2019-06-19 DIAGNOSIS — K51011 Ulcerative (chronic) pancolitis with rectal bleeding: Secondary | ICD-10-CM | POA: Diagnosis not present

## 2019-06-19 DIAGNOSIS — K5730 Diverticulosis of large intestine without perforation or abscess without bleeding: Secondary | ICD-10-CM | POA: Diagnosis not present

## 2019-06-19 DIAGNOSIS — R194 Change in bowel habit: Secondary | ICD-10-CM | POA: Diagnosis not present

## 2019-06-19 DIAGNOSIS — Z800 Family history of malignant neoplasm of digestive organs: Secondary | ICD-10-CM | POA: Diagnosis not present

## 2019-06-29 DIAGNOSIS — R194 Change in bowel habit: Secondary | ICD-10-CM | POA: Diagnosis not present

## 2019-06-29 DIAGNOSIS — K6289 Other specified diseases of anus and rectum: Secondary | ICD-10-CM | POA: Diagnosis not present

## 2019-06-29 DIAGNOSIS — Z1211 Encounter for screening for malignant neoplasm of colon: Secondary | ICD-10-CM | POA: Diagnosis not present

## 2019-06-29 DIAGNOSIS — K529 Noninfective gastroenteritis and colitis, unspecified: Secondary | ICD-10-CM | POA: Diagnosis not present

## 2019-06-29 DIAGNOSIS — K6389 Other specified diseases of intestine: Secondary | ICD-10-CM | POA: Diagnosis not present

## 2019-06-29 DIAGNOSIS — K51911 Ulcerative colitis, unspecified with rectal bleeding: Secondary | ICD-10-CM | POA: Diagnosis not present

## 2019-08-03 ENCOUNTER — Other Ambulatory Visit: Payer: Self-pay

## 2019-08-03 DIAGNOSIS — Z20822 Contact with and (suspected) exposure to covid-19: Secondary | ICD-10-CM

## 2019-08-04 LAB — NOVEL CORONAVIRUS, NAA: SARS-CoV-2, NAA: NOT DETECTED

## 2019-08-13 DIAGNOSIS — D225 Melanocytic nevi of trunk: Secondary | ICD-10-CM | POA: Diagnosis not present

## 2019-08-13 DIAGNOSIS — L821 Other seborrheic keratosis: Secondary | ICD-10-CM | POA: Diagnosis not present

## 2019-08-13 DIAGNOSIS — L814 Other melanin hyperpigmentation: Secondary | ICD-10-CM | POA: Diagnosis not present

## 2019-08-13 DIAGNOSIS — D2271 Melanocytic nevi of right lower limb, including hip: Secondary | ICD-10-CM | POA: Diagnosis not present

## 2019-09-08 DIAGNOSIS — Z20828 Contact with and (suspected) exposure to other viral communicable diseases: Secondary | ICD-10-CM | POA: Diagnosis not present

## 2019-09-14 ENCOUNTER — Ambulatory Visit: Payer: BLUE CROSS/BLUE SHIELD | Admitting: Certified Nurse Midwife

## 2019-10-03 ENCOUNTER — Ambulatory Visit: Payer: BC Managed Care – PPO | Admitting: Certified Nurse Midwife

## 2019-10-18 ENCOUNTER — Ambulatory Visit: Payer: BC Managed Care – PPO | Attending: Internal Medicine

## 2019-10-18 DIAGNOSIS — Z20822 Contact with and (suspected) exposure to covid-19: Secondary | ICD-10-CM | POA: Diagnosis not present

## 2019-10-20 LAB — NOVEL CORONAVIRUS, NAA: SARS-CoV-2, NAA: NOT DETECTED

## 2019-10-23 DIAGNOSIS — K5190 Ulcerative colitis, unspecified, without complications: Secondary | ICD-10-CM | POA: Diagnosis not present

## 2019-10-23 DIAGNOSIS — K5730 Diverticulosis of large intestine without perforation or abscess without bleeding: Secondary | ICD-10-CM | POA: Diagnosis not present

## 2019-10-23 DIAGNOSIS — K580 Irritable bowel syndrome with diarrhea: Secondary | ICD-10-CM | POA: Diagnosis not present

## 2019-11-05 ENCOUNTER — Other Ambulatory Visit: Payer: Self-pay

## 2019-11-06 ENCOUNTER — Encounter: Payer: Self-pay | Admitting: Certified Nurse Midwife

## 2019-11-06 ENCOUNTER — Ambulatory Visit (INDEPENDENT_AMBULATORY_CARE_PROVIDER_SITE_OTHER): Payer: BC Managed Care – PPO | Admitting: Certified Nurse Midwife

## 2019-11-06 ENCOUNTER — Other Ambulatory Visit: Payer: Self-pay

## 2019-11-06 VITALS — BP 120/70 | HR 74 | Temp 97.2°F | Resp 70 | Ht 66.5 in | Wt 163.0 lb

## 2019-11-06 DIAGNOSIS — N951 Menopausal and female climacteric states: Secondary | ICD-10-CM | POA: Diagnosis not present

## 2019-11-06 DIAGNOSIS — Z01419 Encounter for gynecological examination (general) (routine) without abnormal findings: Secondary | ICD-10-CM | POA: Diagnosis not present

## 2019-11-06 DIAGNOSIS — N912 Amenorrhea, unspecified: Secondary | ICD-10-CM

## 2019-11-06 DIAGNOSIS — Z0000 Encounter for general adult medical examination without abnormal findings: Secondary | ICD-10-CM | POA: Diagnosis not present

## 2019-11-06 LAB — POCT URINE PREGNANCY: Preg Test, Ur: NEGATIVE

## 2019-11-06 NOTE — Patient Instructions (Signed)
EXERCISE AND DIET:  We recommended that you start or continue a regular exercise program for good health. Regular exercise means any activity that makes your heart beat faster and makes you sweat.  We recommend exercising at least 30 minutes per day at least 3 days a week, preferably 4 or 5.  We also recommend a diet low in fat and sugar.  Inactivity, poor dietary choices and obesity can cause diabetes, heart attack, stroke, and kidney damage, among others.    ALCOHOL AND SMOKING:  Women should limit their alcohol intake to no more than 7 drinks/beers/glasses of wine (combined, not each!) per week. Moderation of alcohol intake to this level decreases your risk of breast cancer and liver damage. And of course, no recreational drugs are part of a healthy lifestyle.  And absolutely no smoking or even second hand smoke. Most people know smoking can cause heart and lung diseases, but did you know it also contributes to weakening of your bones? Aging of your skin?  Yellowing of your teeth and nails?  CALCIUM AND VITAMIN D:  Adequate intake of calcium and Vitamin D are recommended.  The recommendations for exact amounts of these supplements seem to change often, but generally speaking 600 mg of calcium (either carbonate or citrate) and 800 units of Vitamin D per day seems prudent. Certain women may benefit from higher intake of Vitamin D.  If you are among these women, your doctor will have told you during your visit.    PAP SMEARS:  Pap smears, to check for cervical cancer or precancers,  have traditionally been done yearly, although recent scientific advances have shown that most women can have pap smears less often.  However, every woman still should have a physical exam from her gynecologist every year. It will include a breast check, inspection of the vulva and vagina to check for abnormal growths or skin changes, a visual exam of the cervix, and then an exam to evaluate the size and shape of the uterus and  ovaries.  And after 55 years of age, a rectal exam is indicated to check for rectal cancers. We will also provide age appropriate advice regarding health maintenance, like when you should have certain vaccines, screening for sexually transmitted diseases, bone density testing, colonoscopy, mammograms, etc.   MAMMOGRAMS:  All women over 46 years old should have a yearly mammogram. Many facilities now offer a "3D" mammogram, which may cost around $50 extra out of pocket. If possible,  we recommend you accept the option to have the 3D mammogram performed.  It both reduces the number of women who will be called back for extra views which then turn out to be normal, and it is better than the routine mammogram at detecting truly abnormal areas.    COLONOSCOPY:  Colonoscopy to screen for colon cancer is recommended for all women at age 62.  We know, you hate the idea of the prep.  We agree, BUT, having colon cancer and not knowing it is worse!!  Colon cancer so often starts as a polyp that can be seen and removed at colonscopy, which can quite literally save your life!  And if your first colonoscopy is normal and you have no family history of colon cancer, most women don't have to have it again for 10 years.  Once every ten years, you can do something that may end up saving your life, right?  We will be happy to help you get it scheduled when you are ready.  Be sure to check your insurance coverage so you understand how much it will cost.  It may be covered as a preventative service at no cost, but you should check your particular policy.      Perimenopause  Perimenopause is the normal time of life before and after menstrual periods stop completely (menopause). Perimenopause can begin 2-8 years before menopause, and it usually lasts for 1 year after menopause. During perimenopause, the ovaries may or may not produce an egg. What are the causes? This condition is caused by a natural change in hormone levels that  happens as you get older. What increases the risk? This condition is more likely to start at an earlier age if you have certain medical conditions or treatments, including:  A tumor of the pituitary gland in the brain.  A disease that affects the ovaries and hormone production.  Radiation treatment for cancer.  Certain cancer treatments, such as chemotherapy or hormone (anti-estrogen) therapy.  Heavy smoking and excessive alcohol use.  Family history of early menopause. What are the signs or symptoms? Perimenopausal changes affect each woman differently. Symptoms of this condition may include:  Hot flashes.  Night sweats.  Irregular menstrual periods.  Decreased sex drive.  Vaginal dryness.  Headaches.  Mood swings.  Depression.  Memory problems or trouble concentrating.  Irritability.  Tiredness.  Weight gain.  Anxiety.  Trouble getting pregnant. How is this diagnosed? This condition is diagnosed based on your medical history, a physical exam, your age, your menstrual history, and your symptoms. Hormone tests may also be done. How is this treated? In some cases, no treatment is needed. You and your health care provider should make a decision together about whether treatment is necessary. Treatment will be based on your individual condition and preferences. Various treatments are available, such as:  Menopausal hormone therapy (MHT).  Medicines to treat specific symptoms.  Acupuncture.  Vitamin or herbal supplements. Before starting treatment, make sure to let your health care provider know if you have a personal or family history of:  Heart disease.  Breast cancer.  Blood clots.  Diabetes.  Osteoporosis. Follow these instructions at home: Lifestyle  Do not use any products that contain nicotine or tobacco, such as cigarettes and e-cigarettes. If you need help quitting, ask your health care provider.  Eat a balanced diet that includes fresh  fruits and vegetables, whole grains, soybeans, eggs, lean meat, and low-fat dairy.  Get at least 30 minutes of physical activity on 5 or more days each week.  Avoid alcoholic and caffeinated beverages, as well as spicy foods. This may help prevent hot flashes.  Get 7-8 hours of sleep each night.  Dress in layers that can be removed to help you manage hot flashes.  Find ways to manage stress, such as deep breathing, meditation, or journaling. General instructions  Keep track of your menstrual periods, including: ? When they occur. ? How heavy they are and how long they last. ? How much time passes between periods.  Keep track of your symptoms, noting when they start, how often you have them, and how long they last.  Take over-the-counter and prescription medicines only as told by your health care provider.  Take vitamin supplements only as told by your health care provider. These may include calcium, vitamin E, and vitamin D.  Use vaginal lubricants or moisturizers to help with vaginal dryness and improve comfort during sex.  Talk with your health care provider before starting any herbal supplements.  Keep all follow-up visits as told by your health care provider. This is important. This includes any group therapy or counseling. Contact a health care provider if:  You have heavy vaginal bleeding or pass blood clots.  Your period lasts more than 2 days longer than normal.  Your periods are recurring sooner than 21 days.  You bleed after having sex. Get help right away if:  You have chest pain, trouble breathing, or trouble talking.  You have severe depression.  You have pain when you urinate.  You have severe headaches.  You have vision problems. Summary  Perimenopause is the time when a woman's body begins to move into menopause. This may happen naturally or as a result of other health problems or medical treatments.  Perimenopause can begin 2-8 years before  menopause, and it usually lasts for 1 year after menopause.  Perimenopausal symptoms can be managed through medicines, lifestyle changes, and complementary therapies such as acupuncture. This information is not intended to replace advice given to you by your health care provider. Make sure you discuss any questions you have with your health care provider. Document Revised: 09/09/2017 Document Reviewed: 11/02/2016 Elsevier Patient Education  2020 [employer].

## 2019-11-06 NOTE — Progress Notes (Signed)
54 y.o. G0P0000 Single  Caucasian Fe here for annual exam. Last period in 04/2019 and had spotting every other month. Noted some spotting a couple days in 09/2019 no bleeding since that time. Negative UPT today. No hot flashes or night sweats. Ulcerative colitis has improved, but not in remission, seeing Dr. Collene Mares.  Has not established with PCP as of yet, request information on current providers.No weight gain from last year. No other health issues today.        Sexually active: No.  The current method of family planning is abstinence.    Exercising: Yes.    walking & yoga Smoker:  no  Review of Systems  Constitutional: Negative.   HENT: Negative.   Eyes: Negative.   Respiratory: Negative.   Cardiovascular: Negative.   Gastrointestinal: Negative.   Genitourinary: Negative.   Musculoskeletal: Negative.   Skin: Negative.   Neurological: Negative.   Endo/Heme/Allergies: Negative.   Psychiatric/Behavioral: Negative.     Health Maintenance: Pap:08/03/2017 negative, HPV negative  09-06-18 neg  History of Abnormal Pap: no MMG:  11-14-2018 category c density birads 1:neg Self Breast exams: yes Colonoscopy:  06/2019 normal with inflammation BMD:   none TDaP:  2016  Shingles: no Pneumonia: no  Hep C and HIV: both neg 2017 Labs: poct-neg   reports that she has quit smoking. She has never used smokeless tobacco. She reports current alcohol use. She reports that she does not use drugs.  Past Medical History:  Diagnosis Date  . Allergy   . Anxiety    in the past  . Thoracic outlet syndrome   . Ulcerative colitis [facility])     Past Surgical History:  Procedure Laterality Date  . FIRST RIB REMOVAL     for thoracic outlet syndrome    Current Outpatient Medications  Medication Sig Dispense Refill  . Ascorbic Acid (VITAMIN C PO) Take by mouth.    . B Complex Vitamins (B COMPLEX PO) Take by mouth.    . cetirizine (ZYRTEC) 10 MG tablet Take 10 mg by mouth as needed.     . Cholecalciferol  (VITAMIN D PO) Take 1,000 Int'l Units by mouth daily.     . mesalamine (LIALDA) 1.2 g EC tablet     . Pseudoephedrine HCl (SUDAFED PO) Take by mouth as needed.     No current facility-administered medications for this visit.    Family History  Problem Relation Age of Onset  . Heart disease Mother   . Anxiety disorder Mother   . Depression Mother        severe depression  . Heart disease Father        chf  . Hypertension Father   . Heart attack Father   . Breast cancer Maternal Grandmother        age 39's  . Heart attack Maternal Grandmother     ROS:  Pertinent items are noted in HPI.  Otherwise, a comprehensive ROS was negative.  Exam:   There were no vitals taken for this visit.   Ht Readings from Last 3 Encounters:  09/06/18 5' 5.75" (1.67 m)  08/03/17 5' 6"  (1.676 m)  11/04/15 5' 6.25" (1.683 m)    General appearance: alert, cooperative and appears stated age Head: Normocephalic, without obvious abnormality, atraumatic Neck: no adenopathy, supple, symmetrical, trachea midline and thyroid normal to inspection and palpation Lungs: clear to auscultation bilaterally Breasts: normal appearance, no masses or tenderness, No nipple retraction or dimpling, No nipple discharge or bleeding, No axillary or  supraclavicular adenopathy Heart: regular rate and rhythm Abdomen: soft, non-tender; no masses,  no organomegaly Extremities: extremities normal, atraumatic, no cyanosis or edema Skin: Skin color, texture, turgor normal. No rashes or lesions Lymph nodes: Cervical, supraclavicular, and axillary nodes normal. No abnormal inguinal nodes palpated Neurologic: Grossly normal   Pelvic: External genitalia:  no lesions              Urethra:  normal appearing urethra with no masses, tenderness or lesions              Bartholin's and Skene's: normal                 Vagina: normal appearing vagina with normal color and discharge, no lesions              Cervix: no cervical motion  tenderness, no lesions and normal appearance              Pap taken: No. Bimanual Exam:  Uterus:  normal size, contour, position, consistency, mobility, non-tender and anteverted              Adnexa: normal adnexa and no mass, fullness, tenderness               Rectovaginal: Confirms               Anus:  normal sphincter tone, no lesions  Chaperone present: yes  A:  Well Woman with normal exam  Perimenopausal with amenorrhea with negative UPT  Ulcerative colitis with Dr. Collene Mares GI management, stable on medication  Mammogram due in 11/2019    P:   Reviewed health and wellness pertinent to exam  Discussed Perimenopause/Menopausae and expectations. Discussed importance of keeping a menstrual calendar of bleeding. Menopausal if no menses or bleeding on one year. Discussed drawing labs to assess today. Patient agreeable.  Lab: TSH,FSH,Prolactin  Pap smear: no   counseled on breast self exam, mammography screening, feminine hygiene, menopause, adequate intake of calcium and vitamin D, diet and exercise, Kegel's exercises  return annually or prn  An After Visit Summary was printed and given to the patient.

## 2019-11-07 LAB — LIPID PANEL
Chol/HDL Ratio: 2.7 ratio (ref 0.0–4.4)
Cholesterol, Total: 194 mg/dL (ref 100–199)
HDL: 71 mg/dL (ref >39)
LDL Chol Calc (NIH): 106 mg/dL — ABNORMAL HIGH (ref 0–99)
Triglycerides: 93 mg/dL (ref 0–149)
VLDL Cholesterol Cal: 17 mg/dL (ref 5–40)

## 2019-11-07 LAB — TSH: TSH: 1.49 u[IU]/mL (ref 0.450–4.500)

## 2019-11-07 LAB — FOLLICLE STIMULATING HORMONE: FSH: 55.5 m[IU]/mL

## 2019-11-07 LAB — PROLACTIN: Prolactin: 14.4 ng/mL (ref 4.8–23.3)

## 2019-11-09 ENCOUNTER — Other Ambulatory Visit: Payer: Self-pay | Admitting: Certified Nurse Midwife

## 2019-11-09 DIAGNOSIS — Z1231 Encounter for screening mammogram for malignant neoplasm of breast: Secondary | ICD-10-CM

## 2019-11-20 DIAGNOSIS — K51011 Ulcerative (chronic) pancolitis with rectal bleeding: Secondary | ICD-10-CM | POA: Diagnosis not present

## 2019-11-20 DIAGNOSIS — R197 Diarrhea, unspecified: Secondary | ICD-10-CM | POA: Diagnosis not present

## 2019-11-20 DIAGNOSIS — K5730 Diverticulosis of large intestine without perforation or abscess without bleeding: Secondary | ICD-10-CM | POA: Diagnosis not present

## 2019-11-22 DIAGNOSIS — R197 Diarrhea, unspecified: Secondary | ICD-10-CM | POA: Diagnosis not present

## 2019-12-14 ENCOUNTER — Other Ambulatory Visit: Payer: Self-pay

## 2019-12-14 ENCOUNTER — Ambulatory Visit
Admission: RE | Admit: 2019-12-14 | Discharge: 2019-12-14 | Disposition: A | Discharge status: Home / Self Care | Payer: BC Managed Care – PPO | Source: Ambulatory Visit | Attending: Certified Nurse Midwife | Admitting: Certified Nurse Midwife

## 2019-12-14 DIAGNOSIS — Z1231 Encounter for screening mammogram for malignant neoplasm of breast: Secondary | ICD-10-CM

## 2019-12-18 NOTE — Progress Notes (Signed)
Mammogram reviewed negative . Density is C repeat yearly with 3 D

## 2019-12-31 ENCOUNTER — Encounter: Payer: Self-pay | Admitting: Certified Nurse Midwife

## 2020-01-07 ENCOUNTER — Telehealth: Payer: Self-pay

## 2020-01-07 NOTE — Telephone Encounter (Signed)
Left message to call Sharee Pimple, RN at [facility].    Gerber 55.5 on 11/06/19

## 2020-01-07 NOTE — Telephone Encounter (Signed)
Patient called in regards to irregular bleeding. Patient stated that she was told to call if there were any irregular bleeding.

## 2020-01-08 NOTE — Telephone Encounter (Signed)
Spoke with patient. Patient reports vaginal bleeding that started on 01/03/20, describes as normal menses. LMP was 04/2019. Last [place] 55.5 on 11/06/19. Reports headache and cramps with bleeding. Flow has tapered down, changing pad 3 times a day. Denies any other GYN symptoms or pain.   OV scheduled for 01/16/20 at 4:30pm with Dr. Talbert Nan. Patient declined multiple appts offered due to her work schedule. Advised patient to continue to monitor symptoms, return call to office if new symptoms develop or bleeding becomes heavy. Patient verbalizes understanding and is agreeable.   Routing to provider for final review. Patient is agreeable to disposition. Will close encounter.

## 2020-01-15 ENCOUNTER — Other Ambulatory Visit: Payer: Self-pay

## 2020-01-16 ENCOUNTER — Ambulatory Visit (INDEPENDENT_AMBULATORY_CARE_PROVIDER_SITE_OTHER): Payer: BC Managed Care – PPO | Admitting: Obstetrics and Gynecology

## 2020-01-16 ENCOUNTER — Encounter: Payer: Self-pay | Admitting: Obstetrics and Gynecology

## 2020-01-16 ENCOUNTER — Other Ambulatory Visit (HOSPITAL_COMMUNITY)
Admission: RE | Admit: 2020-01-16 | Discharge: 2020-01-16 | Disposition: A | Discharge status: Home / Self Care | Payer: BC Managed Care – PPO | Source: Ambulatory Visit | Attending: Obstetrics and Gynecology | Admitting: Obstetrics and Gynecology

## 2020-01-16 VITALS — BP 122/70 | HR 80 | Temp 98.1°F | Resp 10 | Ht 66.5 in

## 2020-01-16 DIAGNOSIS — Z124 Encounter for screening for malignant neoplasm of cervix: Secondary | ICD-10-CM

## 2020-01-16 DIAGNOSIS — N949 Unspecified condition associated with female genital organs and menstrual cycle: Secondary | ICD-10-CM | POA: Diagnosis not present

## 2020-01-16 DIAGNOSIS — N939 Abnormal uterine and vaginal bleeding, unspecified: Secondary | ICD-10-CM | POA: Insufficient documentation

## 2020-01-16 DIAGNOSIS — N882 Stricture and stenosis of cervix uteri: Secondary | ICD-10-CM

## 2020-01-16 DIAGNOSIS — N951 Menopausal and female climacteric states: Secondary | ICD-10-CM

## 2020-01-16 MED ORDER — MISOPROSTOL 200 MCG PO TABS: ORAL_TABLET | ORAL | 0 refills | Status: DC

## 2020-01-16 NOTE — Progress Notes (Signed)
GYNECOLOGY  VISIT   HPI: 54 y.o.   Single White or Caucasian Not Hispanic or Latino  female   G0P0000 with Patient's last menstrual period was 01/03/2020.   here for PMB.     The patient's LMP was in 7/20, since then she had occasional spotting, last in the fall. She then started bleeding on 01/03/20 x 4.5 days. Saturating a pad in 1/2 a day. No cramping. She did get a headache during the bleeding. Headache is common for her with her cycle.  Has only had 2 hot flashes, no night sweats.   GYNECOLOGIC HISTORY: Patient's last menstrual period was 01/03/2020. Contraception:Postmenopausal Menopausal hormone therapy: none        OB History    Gravida  0   Para  0   Term  0   Preterm  0   AB  0   Living  0     SAB  0   TAB  0   Ectopic  0   Multiple  0   Live Births                 There are no problems to display for this patient.   Past Medical History:  Diagnosis Date  . Allergy   . Anxiety    in the past  . Thoracic outlet syndrome   . Ulcerative colitis [facility])     Past Surgical History:  Procedure Laterality Date  . FIRST RIB REMOVAL     for thoracic outlet syndrome    Current Outpatient Medications  Medication Sig Dispense Refill  . Ascorbic Acid (VITAMIN C PO) Take by mouth.    . B Complex Vitamins (B COMPLEX PO) Take by mouth.    . cetirizine (ZYRTEC) 10 MG tablet Take 10 mg by mouth as needed.     . Cholecalciferol (VITAMIN D PO) Take 1,000 Int'l Units by mouth daily.     . mesalamine (LIALDA) 1.2 g EC tablet 2 (two) times daily.     . mesalamine (ROWASA) 4 g enema SMARTSIG:60 Milliliter(s) Rectally Every Night    . Probiotic Product (PROBIOTIC PO) Take by mouth. Ultra flora IB     No current facility-administered medications for this visit.     ALLERGIES: Patient has no known allergies.  Family History  Problem Relation Age of Onset  . Heart disease Mother   . Anxiety disorder Mother   . Depression Mother        severe depression  .  Heart disease Father        chf  . Hypertension Father   . Heart attack Father   . Breast cancer Maternal Grandmother        age 98's  . Heart attack Maternal Grandmother     Social History   Socioeconomic History  . Marital status: Single    Spouse name: Not on file  . Number of children: Not on file  . Years of education: Not on file  . Highest education level: Not on file  Occupational History  . Not on file  Tobacco Use  . Smoking status: Former Research scientist (life sciences)  . Smokeless tobacco: Never Used  Substance and Sexual Activity  . Alcohol use: Not Currently    Alcohol/week: 0.0 standard drinks  . Drug use: No  . Sexual activity: Not Currently    Partners: Male    Birth control/protection: Abstinence  Other Topics Concern  . Not on file  Social History Narrative  . Not on file  Social Determinants of Health   Financial Resource Strain:   . Difficulty of Paying Living Expenses:   Food Insecurity:   . Worried About Charity fundraiser in the Last Year:   . Arboriculturist in the Last Year:   Transportation Needs:   . Film/video editor (Medical):   Marland Kitchen Lack of Transportation (Non-Medical):   Physical Activity:   . Days of Exercise per Week:   . Minutes of Exercise per Session:   Stress:   . Feeling of Stress :   Social Connections:   . Frequency of Communication with Friends and Family:   . Frequency of Social Gatherings with Friends and Family:   . Attends Religious Services:   . Active Member of Clubs or Organizations:   . Attends Archivist Meetings:   Marland Kitchen Marital Status:   Intimate Partner Violence:   . Fear of Current or Ex-Partner:   . Emotionally Abused:   Marland Kitchen Physically Abused:   . Sexually Abused:     Review of Systems  Constitutional: Negative.   HENT: Negative.   Eyes: Negative.   Respiratory: Negative.   Cardiovascular: Negative.   Gastrointestinal: Negative.   Genitourinary: Negative.   Musculoskeletal: Negative.   Skin: Negative.    Neurological: Negative.   Endo/Heme/Allergies: Negative.   Psychiatric/Behavioral: Negative.     PHYSICAL EXAMINATION:    BP 122/70 (BP Location: Right Arm, Patient Position: Sitting, Cuff Size: Normal)   Pulse 80   Temp 98.1 F (36.7 C) (Temporal)   Resp 10   Ht 5' 6.5" (1.689 m)   LMP 01/03/2020   BMI 25.91 kg/m     General appearance: alert, cooperative and appears stated age   Pelvic: External genitalia:  no lesions              Urethra:  normal appearing urethra with no masses, tenderness or lesions              Bartholins and Skenes: normal                 Vagina: normal appearing vagina with normal color and discharge, no lesions              Cervix: no lesions and stenotic              Bimanual Exam:  Uterus:  normal size, contour, position, consistency, mobility, non-tender              Adnexa: fullness on the right, not tender   The risks of endometrial biopsy were reviewed and a consent was obtained.  A speculum was placed in the vagina and the cervix was cleansed with betadine. A tenaculum was placed on the cervix and neither the pipelle or the mini-pipelle could not get past her cervix. Os finder was not helpful. The procedure was stopped. The tenaculum and speculum were removed. There were no complications.                  Chaperone Gae Dry) was present for exam.  ASSESSMENT AUB Perimenopausal Cervical stenosis  Fullness right adnexa (suspect stool)  PLAN Pap Unable to complete endometrial biopsy Will pretreat with cytotec and have her return for an ultrasound and likely repeat attempt at biopsy with ultrasound guidance.

## 2020-01-18 ENCOUNTER — Other Ambulatory Visit: Payer: Self-pay

## 2020-01-18 ENCOUNTER — Telehealth: Payer: Self-pay | Admitting: Obstetrics & Gynecology

## 2020-01-18 DIAGNOSIS — N951 Menopausal and female climacteric states: Secondary | ICD-10-CM

## 2020-01-18 DIAGNOSIS — N939 Abnormal uterine and vaginal bleeding, unspecified: Secondary | ICD-10-CM

## 2020-01-18 NOTE — Telephone Encounter (Signed)
Call to patient. Per DPR, OK to leave message on voicemail.   Left voicemail requesting a return call to [facility] to review benefits and schedule recommended Pelvic ultrasound with Felipa Emory, MD

## 2020-01-18 NOTE — Progress Notes (Signed)
Orders placed for expired PUS and EMB per JJ OV on 01/16/2020. Encounter closed.

## 2020-01-21 LAB — CYTOLOGY - PAP
Comment: NEGATIVE
Diagnosis: NEGATIVE
High risk HPV: NEGATIVE

## 2020-02-07 DIAGNOSIS — Z800 Family history of malignant neoplasm of digestive organs: Secondary | ICD-10-CM | POA: Diagnosis not present

## 2020-02-07 DIAGNOSIS — K51011 Ulcerative (chronic) pancolitis with rectal bleeding: Secondary | ICD-10-CM | POA: Diagnosis not present

## 2020-02-07 DIAGNOSIS — K580 Irritable bowel syndrome with diarrhea: Secondary | ICD-10-CM | POA: Diagnosis not present

## 2020-02-07 DIAGNOSIS — K5730 Diverticulosis of large intestine without perforation or abscess without bleeding: Secondary | ICD-10-CM | POA: Diagnosis not present

## 2020-02-11 ENCOUNTER — Other Ambulatory Visit: Payer: Self-pay

## 2020-02-12 ENCOUNTER — Other Ambulatory Visit: Payer: Self-pay | Admitting: Obstetrics and Gynecology

## 2020-02-12 ENCOUNTER — Encounter: Payer: Self-pay | Admitting: Obstetrics and Gynecology

## 2020-02-12 ENCOUNTER — Ambulatory Visit (INDEPENDENT_AMBULATORY_CARE_PROVIDER_SITE_OTHER): Payer: BC Managed Care – PPO

## 2020-02-12 ENCOUNTER — Ambulatory Visit (INDEPENDENT_AMBULATORY_CARE_PROVIDER_SITE_OTHER): Payer: BC Managed Care – PPO | Admitting: Obstetrics and Gynecology

## 2020-02-12 VITALS — BP 144/68 | HR 81 | Temp 98.1°F | Ht 66.5 in | Wt 163.0 lb

## 2020-02-12 DIAGNOSIS — N939 Abnormal uterine and vaginal bleeding, unspecified: Secondary | ICD-10-CM

## 2020-02-12 DIAGNOSIS — N840 Polyp of corpus uteri: Secondary | ICD-10-CM

## 2020-02-12 DIAGNOSIS — N83202 Unspecified ovarian cyst, left side: Secondary | ICD-10-CM | POA: Diagnosis not present

## 2020-02-12 DIAGNOSIS — N83201 Unspecified ovarian cyst, right side: Secondary | ICD-10-CM

## 2020-02-12 DIAGNOSIS — N882 Stricture and stenosis of cervix uteri: Secondary | ICD-10-CM

## 2020-02-12 DIAGNOSIS — N951 Menopausal and female climacteric states: Secondary | ICD-10-CM

## 2020-02-12 NOTE — Patient Instructions (Signed)
Menopause and Hormone Replacement Therapy Menopause is a normal time of life when menstrual periods stop completely and the ovaries stop producing the female hormones estrogen and progesterone. This lack of hormones can affect your health and cause undesirable symptoms. Hormone replacement therapy (HRT) can relieve some of those symptoms. What is hormone replacement therapy? HRT is the use of artificial (synthetic) hormones to replace hormones that your body has stopped producing because you have reached menopause. What are my options for HRT?  HRT may consist of the synthetic hormones estrogen and progestin, or it may consist of only estrogen (estrogen-only therapy). You and your health care provider will decide which form of HRT is best for you. If you choose to be on HRT and you have a uterus, estrogen and progestin are usually prescribed. Estrogen-only therapy is used for women who do not have a uterus. Possible options for taking HRT include:  Pills.  Patches.  Gels.  Sprays.  Vaginal cream.  Vaginal rings.  Vaginal inserts. The amount of hormone(s) that you take and how long you take the hormone(s) varies according to your health. It is important to:  Begin HRT with the lowest possible dosage.  Stop HRT as soon as your health care provider tells you to stop.  Work with your health care provider so that you feel informed and comfortable with your decisions. What are the benefits of HRT? HRT can reduce the frequency and severity of menopausal symptoms. Benefits of HRT vary according to the kind of symptoms that you have, how severe they are, and your overall health. HRT may help to improve the following symptoms of menopause:  Hot flashes and night sweats. These are sudden feelings of heat that spread over the face and body. The skin may turn red, like a blush. Night sweats are hot flashes that happen while you are sleeping or trying to sleep.  Bone loss (osteoporosis). The  body loses calcium more quickly after menopause, causing the bones to become weaker. This can increase the risk for bone breaks (fractures).  Vaginal dryness. The lining of the vagina can become thin and dry, which can cause pain during sex or cause infection, burning, or itching.  Urinary tract infections.  Urinary incontinence. This is the inability to control when you pass urine.  Irritability.  Short-term memory problems. What are the risks of HRT? Risks of HRT vary depending on your individual health and medical history. Risks of HRT also depend on whether you receive both estrogen and progestin or you receive estrogen only. HRT may increase the risk of:  Spotting. This is when a small amount of blood leaks from the vagina unexpectedly.  Endometrial cancer. This cancer is in the lining of the uterus (endometrium).  Breast cancer.  Increased density of breast tissue. This can make it harder to find breast cancer on a breast X-ray (mammogram).  Stroke.  Heart disease.  Blood clots.  Gallbladder disease.  Liver disease. Risks of HRT can increase if you have any of the following conditions:  Endometrial cancer.  Liver disease.  Heart disease.  Breast cancer.  History of blood clots.  History of stroke. Follow these instructions at home:  Take over-the-counter and prescription medicines only as told by your health care provider.  Get mammograms, pelvic exams, and medical checkups as often as told by your health care provider.  Have Pap tests done as often as told by your health care provider. A Pap test is sometimes called a Pap smear. It  is a screening test that is used to check for signs of cancer of the cervix and vagina. A Pap test can also identify the presence of infection or precancerous changes. Pap tests may be done: ? Every 3 years, starting at age 30. ? Every 5 years, starting after age 23, in combination with testing for human papillomavirus  (HPV). ? More often or less often depending on other medical conditions you have, your age, and other risk factors.  It is up to you to get the results of your Pap test. Ask your health care provider, or the department that is doing the test, when your results will be ready.  Keep all follow-up visits as told by your health care provider. This is important. Contact a health care provider if you have:  Pain or swelling in your legs.  Shortness of breath.  Chest pain.  Lumps or changes in your breasts or armpits.  Slurred speech.  Pain, burning, or bleeding when you urinate.  Unusual vaginal bleeding.  Dizziness or headaches.  Weakness or numbness in any part of your arms or legs.  Pain in your abdomen. Summary  Menopause is a normal time of life when menstrual periods stop completely and the ovaries stop producing the female hormones estrogen and progesterone.  Hormone replacement therapy (HRT) can relieve some of the symptoms of menopause.  HRT can reduce the frequency and severity of menopausal symptoms.  Risks of HRT vary depending on your individual health and medical history. This information is not intended to replace advice given to you by your health care provider. Make sure you discuss any questions you have with your health care provider. Document Revised: 05/30/2018 Document Reviewed: 05/30/2018 Elsevier Patient Education  2020 [employer]. Menopause Menopause is the normal time of life when menstrual periods stop completely. It is usually confirmed by 12 months without a menstrual period. The transition to menopause (perimenopause) most often happens between the ages of 7 and 76. During perimenopause, hormone levels change in your body, which can cause symptoms and affect your health. Menopause may increase your risk for:  Loss of bone (osteoporosis), which causes bone breaks (fractures).  Depression.  Hardening and narrowing of the arteries  (atherosclerosis), which can cause heart attacks and strokes. What are the causes? This condition is usually caused by a natural change in hormone levels that happens as you get older. The condition may also be caused by surgery to remove both ovaries (bilateral oophorectomy). What increases the risk? This condition is more likely to start at an earlier age if you have certain medical conditions or treatments, including:  A tumor of the pituitary gland in the brain.  A disease that affects the ovaries and hormone production.  Radiation treatment for cancer.  Certain cancer treatments, such as chemotherapy or hormone (anti-estrogen) therapy.  Heavy smoking and excessive alcohol use.  Family history of early menopause. This condition is also more likely to develop earlier in women who are very thin. What are the signs or symptoms? Symptoms of this condition include:  Hot flashes.  Irregular menstrual periods.  Night sweats.  Changes in feelings about sex. This could be a decrease in sex drive or an increased comfort around your sexuality.  Vaginal dryness and thinning of the vaginal walls. This may cause painful intercourse.  Dryness of the skin and development of wrinkles.  Headaches.  Problems sleeping (insomnia).  Mood swings or irritability.  Memory problems.  Weight gain.  Hair growth on the face  and chest.  Bladder infections or problems with urinating. How is this diagnosed? This condition is diagnosed based on your medical history, a physical exam, your age, your menstrual history, and your symptoms. Hormone tests may also be done. How is this treated? In some cases, no treatment is needed. You and your health care provider should make a decision together about whether treatment is necessary. Treatment will be based on your individual condition and preferences. Treatment for this condition focuses on managing symptoms. Treatment may include:  Menopausal  hormone therapy (MHT).  Medicines to treat specific symptoms or complications.  Acupuncture.  Vitamin or herbal supplements. Before starting treatment, make sure to let your health care provider know if you have a personal or family history of:  Heart disease.  Breast cancer.  Blood clots.  Diabetes.  Osteoporosis. Follow these instructions at home: Lifestyle  Do not use any products that contain nicotine or tobacco, such as cigarettes and e-cigarettes. If you need help quitting, ask your health care provider.  Get at least 30 minutes of physical activity on 5 or more days each week.  Avoid alcoholic and caffeinated beverages, as well as spicy foods. This may help prevent hot flashes.  Get 7-8 hours of sleep each night.  If you have hot flashes, try: ? Dressing in layers. ? Avoiding things that may trigger hot flashes, such as spicy food, warm places, or stress. ? Taking slow, deep breaths when a hot flash starts. ? Keeping a fan in your home and office.  Find ways to manage stress, such as deep breathing, meditation, or journaling.  Consider going to group therapy with other women who are having menopause symptoms. Ask your health care provider about recommended group therapy meetings. Eating and drinking  Eat a healthy, balanced diet that contains whole grains, lean protein, low-fat dairy, and plenty of fruits and vegetables.  Your health care provider may recommend adding more soy to your diet. Foods that contain soy include tofu, tempeh, and soy milk.  Eat plenty of foods that contain calcium and vitamin D for bone health. Items that are rich in calcium include low-fat milk, yogurt, beans, almonds, sardines, broccoli, and kale. Medicines  Take over-the-counter and prescription medicines only as told by your health care provider.  Talk with your health care provider before starting any herbal supplements. If prescribed, take vitamins and supplements as told by your  health care provider. These may include: ? Calcium. Women age 61 and older should get 1,200 mg (milligrams) of calcium every day. ? Vitamin D. Women need 600-800 International Units of vitamin D each day. ? Vitamins B12 and B6. Aim for 50 micrograms of B12 and 1.5 mg of B6 each day. General instructions  Keep track of your menstrual periods, including: ? When they occur. ? How heavy they are and how long they last. ? How much time passes between periods.  Keep track of your symptoms, noting when they start, how often you have them, and how long they last.  Use vaginal lubricants or moisturizers to help with vaginal dryness and improve comfort during sex.  Keep all follow-up visits as told by your health care provider. This is important. This includes any group therapy or counseling. Contact a health care provider if:  You are still having menstrual periods after age 36.  You have pain during sex.  You have not had a period for 12 months and you develop vaginal bleeding. Get help right away if:  You have: ?  Severe depression. ? Excessive vaginal bleeding. ? Pain when you urinate. ? A fast or irregular heart beat (palpitations). ? Severe headaches. ? Abdomen (abdominal) pain or severe indigestion.  You fell and you think you have a broken bone.  You develop leg or chest pain.  You develop vision problems.  You feel a lump in your breast. Summary  Menopause is the normal time of life when menstrual periods stop completely. It is usually confirmed by 12 months without a menstrual period.  The transition to menopause (perimenopause) most often happens between the ages of 74 and 74.  Symptoms can be managed through medicines, lifestyle changes, and complementary therapies such as acupuncture.  Eat a balanced diet that is rich in nutrients to promote bone health and heart health and to manage symptoms during menopause. This information is not intended to replace advice given  to you by your health care provider. Make sure you discuss any questions you have with your health care provider. Document Revised: 09/09/2017 Document Reviewed: 10/30/2016 Elsevier Patient Education  2020 [place] is the normal time of life before and after menstrual periods stop completely (menopause). Perimenopause can begin 2-8 years before menopause, and it usually lasts for 1 year after menopause. During perimenopause, the ovaries may or may not produce an egg. What are the causes? This condition is caused by a natural change in hormone levels that happens as you get older. What increases the risk? This condition is more likely to start at an earlier age if you have certain medical conditions or treatments, including:  A tumor of the pituitary gland in the brain.  A disease that affects the ovaries and hormone production.  Radiation treatment for cancer.  Certain cancer treatments, such as chemotherapy or hormone (anti-estrogen) therapy.  Heavy smoking and excessive alcohol use.  Family history of early menopause. What are the signs or symptoms? Perimenopausal changes affect each woman differently. Symptoms of this condition may include:  Hot flashes.  Night sweats.  Irregular menstrual periods.  Decreased sex drive.  Vaginal dryness.  Headaches.  Mood swings.  Depression.  Memory problems or trouble concentrating.  Irritability.  Tiredness.  Weight gain.  Anxiety.  Trouble getting pregnant. How is this diagnosed? This condition is diagnosed based on your medical history, a physical exam, your age, your menstrual history, and your symptoms. Hormone tests may also be done. How is this treated? In some cases, no treatment is needed. You and your health care provider should make a decision together about whether treatment is necessary. Treatment will be based on your individual condition and preferences. Various treatments  are available, such as:  Menopausal hormone therapy (MHT).  Medicines to treat specific symptoms.  Acupuncture.  Vitamin or herbal supplements. Before starting treatment, make sure to let your health care provider know if you have a personal or family history of:  Heart disease.  Breast cancer.  Blood clots.  Diabetes.  Osteoporosis. Follow these instructions at home: Lifestyle  Do not use any products that contain nicotine or tobacco, such as cigarettes and e-cigarettes. If you need help quitting, ask your health care provider.  Eat a balanced diet that includes fresh fruits and vegetables, whole grains, soybeans, eggs, lean meat, and low-fat dairy.  Get at least 30 minutes of physical activity on 5 or more days each week.  Avoid alcoholic and caffeinated beverages, as well as spicy foods. This may help prevent hot flashes.  Get 7-8 hours of sleep each night.  Dress in layers that can be removed to help you manage hot flashes.  Find ways to manage stress, such as deep breathing, meditation, or journaling. General instructions  Keep track of your menstrual periods, including: ? When they occur. ? How heavy they are and how long they last. ? How much time passes between periods.  Keep track of your symptoms, noting when they start, how often you have them, and how long they last.  Take over-the-counter and prescription medicines only as told by your health care provider.  Take vitamin supplements only as told by your health care provider. These may include calcium, vitamin E, and vitamin D.  Use vaginal lubricants or moisturizers to help with vaginal dryness and improve comfort during sex.  Talk with your health care provider before starting any herbal supplements.  Keep all follow-up visits as told by your health care provider. This is important. This includes any group therapy or counseling. Contact a health care provider if:  You have heavy vaginal bleeding  or pass blood clots.  Your period lasts more than 2 days longer than normal.  Your periods are recurring sooner than 21 days.  You bleed after having sex. Get help right away if:  You have chest pain, trouble breathing, or trouble talking.  You have severe depression.  You have pain when you urinate.  You have severe headaches.  You have vision problems. Summary  Perimenopause is the time when a woman's body begins to move into menopause. This may happen naturally or as a result of other health problems or medical treatments.  Perimenopause can begin 2-8 years before menopause, and it usually lasts for 1 year after menopause.  Perimenopausal symptoms can be managed through medicines, lifestyle changes, and complementary therapies such as acupuncture. This information is not intended to replace advice given to you by your health care provider. Make sure you discuss any questions you have with your health care provider. Document Revised: 09/09/2017 Document Reviewed: 11/02/2016 Elsevier Patient Education  2020 [employer].

## 2020-02-12 NOTE — Progress Notes (Addendum)
GYNECOLOGY  VISIT   HPI: 54 y.o.   Single White or Caucasian Not Hispanic or Latino  female   [id number] with No LMP recorded.   here for evaluation of AUB. She was seen last month with abnormal bleeding. Endometrial biopsy could not be completed secondary to cervical stenosis. She is perimenopausal. Cycle in 7/20, then just occasional spotting. She went ~6 months without bleeding and then bleed x 4.5 days in late March. Last [place] in 1/21 was 54.  Pap 01/16/20: negative, negative hpv.   GYNECOLOGIC HISTORY: No LMP recorded. Contraception: perimenopausal Menopausal hormone therapy: none        OB History    Gravida  0   Para  0   Term  0   Preterm  0   AB  0   Living  0     SAB  0   TAB  0   Ectopic  0   Multiple  0   Live Births                 There are no problems to display for this patient.   Past Medical History:  Diagnosis Date  . Allergy   . Anxiety    in the past  . Thoracic outlet syndrome   . Ulcerative colitis [facility])     Past Surgical History:  Procedure Laterality Date  . FIRST RIB REMOVAL     for thoracic outlet syndrome    Current Outpatient Medications  Medication Sig Dispense Refill  . Ascorbic Acid (VITAMIN C PO) Take by mouth.    . B Complex Vitamins (B COMPLEX PO) Take by mouth.    . cetirizine (ZYRTEC) 10 MG tablet Take 10 mg by mouth as needed.     . Cholecalciferol (VITAMIN D PO) Take 1,000 Int'l Units by mouth daily.     . mesalamine (LIALDA) 1.2 g EC tablet 2 (two) times daily.     . mesalamine (ROWASA) 4 g enema SMARTSIG:60 Milliliter(s) Rectally Every Night    . misoprostol (CYTOTEC) 200 MCG tablet Place 2 tablets vaginally 6-12 hours prior to procedure 2 tablet 0  . Probiotic Product (PROBIOTIC PO) Take by mouth. Ultra flora IB     No current facility-administered medications for this visit.     ALLERGIES: Patient has no known allergies.  Family History  Problem Relation Age of Onset  . Heart disease Mother   .  Anxiety disorder Mother   . Depression Mother        severe depression  . Heart disease Father        chf  . Hypertension Father   . Heart attack Father   . Breast cancer Maternal Grandmother        age 14's  . Heart attack Maternal Grandmother     Social History   Socioeconomic History  . Marital status: Single    Spouse name: Not on file  . Number of children: Not on file  . Years of education: Not on file  . Highest education level: Not on file  Occupational History  . Not on file  Tobacco Use  . Smoking status: Former Research scientist (life sciences)  . Smokeless tobacco: Never Used  Substance and Sexual Activity  . Alcohol use: Not Currently    Alcohol/week: 0.0 standard drinks  . Drug use: No  . Sexual activity: Not Currently    Partners: Male    Birth control/protection: Abstinence  Other Topics Concern  . Not on file  Social  History Narrative  . Not on file   Social Determinants of Health   Financial Resource Strain:   . Difficulty of Paying Living Expenses:   Food Insecurity:   . Worried About Charity fundraiser in the Last Year:   . Arboriculturist in the Last Year:   Transportation Needs:   . Film/video editor (Medical):   Marland Kitchen Lack of Transportation (Non-Medical):   Physical Activity:   . Days of Exercise per Week:   . Minutes of Exercise per Session:   Stress:   . Feeling of Stress :   Social Connections:   . Frequency of Communication with Friends and Family:   . Frequency of Social Gatherings with Friends and Family:   . Attends Religious Services:   . Active Member of Clubs or Organizations:   . Attends Archivist Meetings:   Marland Kitchen Marital Status:   Intimate Partner Violence:   . Fear of Current or Ex-Partner:   . Emotionally Abused:   Marland Kitchen Physically Abused:   . Sexually Abused:     ROS  PHYSICAL EXAMINATION:    There were no vitals taken for this visit.    General appearance: alert, cooperative and appears stated age  Pelvic: External genitalia:   no lesions              Urethra:  normal appearing urethra with no masses, tenderness or lesions              Bartholins and Skenes: normal                 Vagina: normal appearing vagina with normal color and discharge, no lesions              Cervix: no lesions, stenotic  Sonohysterogram The procedure and risks of the procedure were reviewed with the patient, consent form was signed. A speculum was placed in the vagina and the cervix was cleansed with betadine. A tenaculum was placed on the cerivx. With ultrasound guidance the cervix was dilated with the mini-dilators. The sonohysterogram catheter was inserted into the uterine cavity without difficulty. Saline was infused under direct observation with the ultrasound. No intracavitary defects were noted.The catheter was removed.   Chaperone was present for exam.  Ultrasound images reviewed with the patient. Bilateral ovarian cysts, left with calcified components, suspected dermoid. Other ovary with multiple simple appearing cysts.   ASSESSMENT Abnormal perimenopausal with h/o cervical stenosis, pretreated with cytotec. Needed cervical dilation. Sonohysterogram with endometrial polyps Bilateral ovarian cysts, left one appears to be a dermoid, the other is multicystic (all simple)    PLAN FSH and CA 125 today Plan hysteroscopy, polypectomy, D&C, laparoscopy, LSO, RS, possible RSO Will do one more ultrasound prior to surgery to help decide on removal of her other ovary, today that ovary is multicystic  If both ovaries are removed we discussed menopause and possible need for treatment.  Discussed risk of surgery, including but not limited to: bleeding, infection, damage to nearby organs (bowel, bladder, blood vessels, ureters)   An After Visit Summary was printed and given to the patient.  In addition to the sonohysterogram and reviewing the images, ~15 minutes was spent in total patient care, mainly discussing management   Addendum:  sonohysterogram was significant for 2 intracavitary defects c/w polyps, both ~1 cm.

## 2020-02-13 LAB — CA 125: Cancer Antigen (CA) 125: 10.2 U/mL (ref 0.0–38.1)

## 2020-02-13 LAB — FOLLICLE STIMULATING HORMONE: FSH: 4.9 m[IU]/mL

## 2020-02-14 ENCOUNTER — Telehealth: Payer: Self-pay | Admitting: Obstetrics and Gynecology

## 2020-02-14 NOTE — Telephone Encounter (Signed)
Spoke with patient regarding surgery benefits. Patient acknowledges understanding of information presented. Patient is aware that benefits presented are for professional benefits only. Patient is aware that once surgery is scheduled, the hospital will call with separate benefits. Patient is aware of surgery cancellation policy.  Patient is ready to proceed with scheduling. Patient has additional clinical questions to discuss with Kaitlyn at time of return call.

## 2020-02-18 NOTE — Telephone Encounter (Signed)
Left message to call [place] Lesure at [phone]. 

## 2020-02-19 ENCOUNTER — Telehealth: Payer: Self-pay | Admitting: *Deleted

## 2020-02-19 NOTE — Telephone Encounter (Signed)
Left message for pt to return call to triage RN. 

## 2020-02-19 NOTE — Telephone Encounter (Signed)
Patient has questions regarding her last visit.

## 2020-02-19 NOTE — Telephone Encounter (Signed)
Left message to call Jill Roman at [phone]. 

## 2020-02-19 NOTE — Telephone Encounter (Signed)
Patient returned call

## 2020-02-21 NOTE — Telephone Encounter (Signed)
Left message for pt to return call to triage RN. 

## 2020-02-26 NOTE — Telephone Encounter (Signed)
Patient is returning call.  °

## 2020-02-26 NOTE — Telephone Encounter (Signed)
OV 02/12/20 with Dr Patience Musca with pt. Pt states wanting to know plan of care for possible future surgery since OV on 02/12/20.  1. How urgent is surgery and what timeframe is Dr Talbert Nan wanting to do surgery?  2. Can possible dermoid on left ovary be removed without taking ovary? 3. Pt states would like to keep right ovary even with simple cysts. Does not want HRT.  4. Pt would like to have surgery if not urgent in July 2021, when does she need PUS scheduled before?   Pt reports no pain and only had light bleeding on 02/12/20-02/14/20 after taking Cytotec  for procedure. Pt states having ulcerative colitis and has flare in recent past and would like to get under control before surgery. Pt states getting better now. Advised pt will review with Dr Talbert Nan and return call. Pt agreeable.   Routing to Dr Talbert Nan.   Per OV notes on 02/12/20 after PUS:  Plan hysteroscopy, polypectomy, D&C, laparoscopy, LSO, RS, possible RSO Will do one more ultrasound prior to surgery to help decide on removal of her other ovary, today that ovary is multicystic.

## 2020-02-27 NOTE — Telephone Encounter (Signed)
Please see telephone call dated 02/19/2020. Encounter closed.

## 2020-02-27 NOTE — Telephone Encounter (Signed)
I think surgery in July, 2021 is okay. Verline Lema will schedule the ultrasound a week or two prior to th surgery.  It may be possible to take the dermoid and leave the ovary, it depends on how stuck it is to the ovary. The risk is of leaving any dermoid behind, but typically it can be removed without the ovary. The other ovary will depend on her f/u ultrasound.   CC: Reesa Chew, RN

## 2020-02-28 NOTE — Telephone Encounter (Signed)
Patient is returning call.  °

## 2020-02-28 NOTE — Telephone Encounter (Signed)
Left message for pt to return call to triage RN. 

## 2020-02-28 NOTE — Telephone Encounter (Signed)
Spoke with pt. Pt given recommendations and update from Dr Talbert Nan. Pt agreeable to plan of care. Pt states will call back and speak with  Verline Lema to  schedule f/u US and to schedule surgery in July 2021.   Routing to Dr Talbert Nan Cc: Verline Lema for surgery scheduling.  Encounter closed.

## 2020-02-29 ENCOUNTER — Telehealth: Payer: Self-pay

## 2020-02-29 NOTE — Telephone Encounter (Signed)
Spoke with patient regarding surgery planning for July 2021. Date options reviewed. Patient is going to speak with her boss on 03/03/2020 and return call to schedule.   Patient is asking   1. How long she will be unable to drive post op? 2. How long she needs to not play tennis post op?

## 2020-03-03 NOTE — Telephone Encounter (Signed)
She can drive when she is off narcotics and feels she can slam on the brakes of her car (should be within a week). No tennis for at least 2 weeks post op.

## 2020-03-03 NOTE — Telephone Encounter (Signed)
Spoke with patient. Advised patient of message as seen below from [place]. Patient verbalizes understanding. Patient still needs to discuss dates with employer and return call. Planning to return call tomorrow.

## 2020-03-04 NOTE — Telephone Encounter (Signed)
Patient is returning call to schedule for surgery.

## 2020-03-05 DIAGNOSIS — R030 Elevated blood-pressure reading, without diagnosis of hypertension: Secondary | ICD-10-CM | POA: Diagnosis not present

## 2020-03-05 DIAGNOSIS — R040 Epistaxis: Secondary | ICD-10-CM | POA: Diagnosis not present

## 2020-03-05 NOTE — Telephone Encounter (Signed)
Left message to call Kaitlyn at [phone]. 

## 2020-03-06 NOTE — Telephone Encounter (Signed)
Spoke with patient. Patient would like to proceed with surgery on 04/21/2020. Surgery scheduled for 04/21/2020 at 11 am at [facility]. Patient is unable to schedule appointments or discuss surgery instructions at this time. Patient will return call when she is not driving to discuss.

## 2020-03-07 DIAGNOSIS — I10 Essential (primary) hypertension: Secondary | ICD-10-CM | POA: Diagnosis not present

## 2020-03-07 DIAGNOSIS — Z87898 Personal history of other specified conditions: Secondary | ICD-10-CM | POA: Diagnosis not present

## 2020-03-12 NOTE — Telephone Encounter (Signed)
Patient is returning call.  °

## 2020-03-13 MED ORDER — MISOPROSTOL 200 MCG PO TABS
ORAL_TABLET | ORAL | 0 refills | Status: DC
Start: 2020-03-13 — End: 2021-02-12

## 2020-03-13 NOTE — Telephone Encounter (Signed)
Spoke with patient. Pre op appointment scheduled for 03/27/2020 at 4:30 pm with Dr.Jertson. COVID test scheduled for 04/17/2020 at 3:15 pm at [facility] location. Patient is aware of the need to quarantine after test until surgery. 2 week post op scheduled for 05/06/2020 at 3:30 pm with Dr.Jertson. PUS for 1 week before surgery scheduled for 04/15/2020 at 3:30 pm. Patient is agreeable to all dates and times. Surgery instructions reviewed and given to [facility] to give patient at her pre op appointment. RX for Cytotec 200 mcg place 2 tablets vaginally night before surgery sent to pharmacy on file. Patient verbalizes understanding.  Dr.Jertson, patient is asking if she will also need Cytotec again before PUS?

## 2020-03-13 NOTE — Telephone Encounter (Signed)
She doesn't need cytotec prior to her ultrasound, just prior to surgery. Thanks for checking

## 2020-03-13 NOTE — Telephone Encounter (Signed)
Patient returning call to Kaitlyn. °

## 2020-03-13 NOTE — Telephone Encounter (Signed)
Left message to call Schyler Counsell at [phone]. 

## 2020-03-14 NOTE — Telephone Encounter (Signed)
Spoke with patient. Advised of message as seen below from [place]. Patient verbalizes understanding.  Encounter closed.

## 2020-03-27 ENCOUNTER — Ambulatory Visit: Payer: BC Managed Care – PPO | Admitting: Obstetrics and Gynecology

## 2020-03-27 ENCOUNTER — Ambulatory Visit: Payer: BC Managed Care – PPO | Admitting: Obstetrics & Gynecology

## 2020-03-27 NOTE — Progress Notes (Deleted)
GYNECOLOGY  VISIT   HPI: 54 y.o.   Single White or Caucasian Not Hispanic or Latino  female   G0P0000 with No LMP recorded.   here for a preoperative visit. She presented with abnormal perimenopausal bleeding H/O cervical stenosis. Sonohysterogram with endometrial polyps (pre treated with cytotec) and bilateral ovarian cysts, left one appears to be a dermoid, the other is multicystic (all simple).   FSH of 4.9, CA 125 10.2.  GYNECOLOGIC HISTORY: No LMP recorded. Contraception:*** Menopausal hormone therapy: ***        OB History    Gravida  0   Para  0   Term  0   Preterm  0   AB  0   Living  0     SAB  0   TAB  0   Ectopic  0   Multiple  0   Live Births                 There are no problems to display for this patient.   Past Medical History:  Diagnosis Date  . Allergy   . Anxiety    in the past  . Thoracic outlet syndrome   . Ulcerative colitis [facility])     Past Surgical History:  Procedure Laterality Date  . FIRST RIB REMOVAL     for thoracic outlet syndrome    Current Outpatient Medications  Medication Sig Dispense Refill  . Ascorbic Acid (VITAMIN C PO) Take by mouth.    . B Complex Vitamins (B COMPLEX PO) Take by mouth.    . cetirizine (ZYRTEC) 10 MG tablet Take 10 mg by mouth as needed.     . Cholecalciferol (VITAMIN D PO) Take 1,000 Int'l Units by mouth daily.     . mesalamine (LIALDA) 1.2 g EC tablet 2 (two) times daily.     . mesalamine (ROWASA) 4 g enema SMARTSIG:60 Milliliter(s) Rectally Every Night    . misoprostol (CYTOTEC) 200 MCG tablet Place 2 tablets vaginally 6-12 hours prior to procedure 2 tablet 0  . misoprostol (CYTOTEC) 200 MCG tablet Place 2 tablets vaginally 6-12 hours prior to procedure. 2 tablet 0  . Probiotic Product (PROBIOTIC PO) Take by mouth. Ultra flora IB     No current facility-administered medications for this visit.     ALLERGIES: Patient has no known allergies.  Family History  Problem Relation Age of  Onset  . Heart disease Mother   . Anxiety disorder Mother   . Depression Mother        severe depression  . Heart disease Father        chf  . Hypertension Father   . Heart attack Father   . Breast cancer Maternal Grandmother        age 76's  . Heart attack Maternal Grandmother     Social History   Socioeconomic History  . Marital status: Single    Spouse name: Not on file  . Number of children: Not on file  . Years of education: Not on file  . Highest education level: Not on file  Occupational History  . Not on file  Tobacco Use  . Smoking status: Former Research scientist (life sciences)  . Smokeless tobacco: Never Used  Vaping Use  . Vaping Use: Never used  Substance and Sexual Activity  . Alcohol use: Not Currently    Alcohol/week: 0.0 standard drinks  . Drug use: No  . Sexual activity: Not Currently    Partners: Male    Birth  control/protection: Abstinence  Other Topics Concern  . Not on file  Social History Narrative  . Not on file   Social Determinants of Health   Financial Resource Strain:   . Difficulty of Paying Living Expenses:   Food Insecurity:   . Worried About Charity fundraiser in the Last Year:   . Arboriculturist in the Last Year:   Transportation Needs:   . Film/video editor (Medical):   Marland Kitchen Lack of Transportation (Non-Medical):   Physical Activity:   . Days of Exercise per Week:   . Minutes of Exercise per Session:   Stress:   . Feeling of Stress :   Social Connections:   . Frequency of Communication with Friends and Family:   . Frequency of Social Gatherings with Friends and Family:   . Attends Religious Services:   . Active Member of Clubs or Organizations:   . Attends Archivist Meetings:   Marland Kitchen Marital Status:   Intimate Partner Violence:   . Fear of Current or Ex-Partner:   . Emotionally Abused:   Marland Kitchen Physically Abused:   . Sexually Abused:     ROS  PHYSICAL EXAMINATION:    There were no vitals taken for this visit.    General  appearance: alert, cooperative and appears stated age Neck: no adenopathy, supple, symmetrical, trachea midline and thyroid {CHL AMB PHY EX THYROID NORM DEFAULT:[phone]::"normal to inspection and palpation"} Breasts: {Exam; breast:13139::"normal appearance, no masses or tenderness"} Abdomen: soft, non-tender; non distended, no masses,  no organomegaly  Pelvic: External genitalia:  no lesions              Urethra:  normal appearing urethra with no masses, tenderness or lesions              Bartholins and Skenes: normal                 Vagina: normal appearing vagina with normal color and discharge, no lesions              Cervix: {CHL AMB PHY EX CERVIX NORM DEFAULT:[phone]::"no lesions"}              Bimanual Exam:  Uterus:  {CHL AMB PHY EX UTERUS NORM DEFAULT:[phone]::"normal size, contour, position, consistency, mobility, non-tender"}              Adnexa: {CHL AMB PHY EX ADNEXA NO MASS DEFAULT:[phone]::"no mass, fullness, tenderness"}              Rectovaginal: {yes no:[id number]}.  Confirms.              Anus:  normal sphincter tone, no lesions  Chaperone was present for exam.  ASSESSMENT Abnormal perimenopausal bleeding, endometrial polyps on sonohysterogam Cervical stenosis, will pretreat with cytotec Bilateral ovarian cysts. Left appears to be a dermoid, right is multicystic.      PLAN Plan hysteroscopy, polypectomy, D&C, laparoscopy, LSO, RS, possible RSO Return for a repeat ultrasound 1 week prior to surgery   An After Visit Summary was printed and given to the patient.  *** minutes face to face time of which over 50% was spent in counseling.

## 2020-04-10 ENCOUNTER — Telehealth: Payer: Self-pay

## 2020-04-10 NOTE — Telephone Encounter (Signed)
Left detailed message for patient at number provided, okay per ROI, advised of surgery time change for 04/15/2020. Surgery is now at 11 am and she will need to arrive at 9 am. Advised to return call with any additional questions.  Routing to provider and will close encounter.

## 2020-04-13 NOTE — Telephone Encounter (Signed)
Please review this encounter.

## 2020-04-15 ENCOUNTER — Other Ambulatory Visit: Payer: BC Managed Care – PPO | Admitting: Obstetrics and Gynecology

## 2020-04-15 ENCOUNTER — Telehealth: Payer: Self-pay | Admitting: Obstetrics and Gynecology

## 2020-04-15 ENCOUNTER — Other Ambulatory Visit: Payer: BC Managed Care – PPO

## 2020-04-15 NOTE — Telephone Encounter (Signed)
Patient asked to talk with Jill Roman about her surgery.

## 2020-04-16 NOTE — Telephone Encounter (Signed)
Patient wants to cancel her surgery and ultrasound appointment. Patient states she has to work and will need to postpone surgery.

## 2020-04-16 NOTE — Telephone Encounter (Signed)
Spoke with patient. Patient states that she would like to cancel her surgery for next week and does not wish to reschedule at this time due to work. Patient will call back to discuss as she is in the car.

## 2020-04-16 NOTE — Telephone Encounter (Signed)
Spoke with patient. Surgery cancelled. Patient wants to cancel PUS for tomorrow as well. PUS cancelled. Patient aware of surgery cancellation policy. Reports that some personal things have come up and she is having second thoughts. Would potentially like a second opinion before proceeding. Would like to know if this could just be monitored and how long this could be postponed? Does not desire to reschedule at this time.

## 2020-04-17 ENCOUNTER — Other Ambulatory Visit: Payer: BC Managed Care – PPO | Admitting: Obstetrics and Gynecology

## 2020-04-17 ENCOUNTER — Other Ambulatory Visit (HOSPITAL_COMMUNITY): Payer: Self-pay

## 2020-04-17 ENCOUNTER — Other Ambulatory Visit: Payer: BC Managed Care – PPO

## 2020-04-17 NOTE — Telephone Encounter (Signed)
Please let the patient know that I have reviewed her ultrasound images again. She has what appears to be 2 polyps in her endometrium. Polyps associated with abnormal uterine bleeding should be removed. The vast majority of them are benign, but there is a small chance of a pre cancer or cancer. At minimum she should have a hysteroscopy, D&C (with the provider of her choice). She definitely needs repeat imaging of her ovaries. I suspect the cyst on the left is a dermoid. If the cyst on the left is a dermoid, there is a small chance of malignant transformation, torsion or rupture.  I was hopeful that the right ovary would be normal on repeat ultrasound. After a repeat ultrasound options could be discussed.  I would encourage her to get a repeat ultrasound (here or elsewhere) and get a second opinion, whatever makes this easier for her.

## 2020-04-17 NOTE — Telephone Encounter (Signed)
Left message to call Christofer Shen at [phone]. 

## 2020-04-18 NOTE — Telephone Encounter (Signed)
Spoke with patient. Advised of message as seen below from [place]. Patient would like to think on this further and return call with what she would like to do moving forward.  Routing to provider and will close encounter.

## 2020-04-18 NOTE — Telephone Encounter (Signed)
Patient is returning call to [facility].

## 2020-04-21 ENCOUNTER — Ambulatory Visit (HOSPITAL_BASED_OUTPATIENT_CLINIC_OR_DEPARTMENT_OTHER): Admit: 2020-04-21 | Payer: BC Managed Care – PPO | Admitting: Obstetrics and Gynecology

## 2020-04-21 ENCOUNTER — Encounter (HOSPITAL_BASED_OUTPATIENT_CLINIC_OR_DEPARTMENT_OTHER): Payer: Self-pay

## 2020-04-21 SURGERY — DILATATION & CURETTAGE/HYSTEROSCOPY WITH MYOSURE
Anesthesia: Choice

## 2020-05-05 ENCOUNTER — Ambulatory Visit: Payer: BC Managed Care – PPO | Admitting: Obstetrics & Gynecology

## 2020-05-06 ENCOUNTER — Ambulatory Visit: Payer: BC Managed Care – PPO | Admitting: Obstetrics and Gynecology

## 2020-05-15 DIAGNOSIS — Z03818 Encounter for observation for suspected exposure to other biological agents ruled out: Secondary | ICD-10-CM | POA: Diagnosis not present

## 2020-06-03 DIAGNOSIS — I10 Essential (primary) hypertension: Secondary | ICD-10-CM | POA: Diagnosis not present

## 2020-09-16 DIAGNOSIS — I10 Essential (primary) hypertension: Secondary | ICD-10-CM | POA: Diagnosis not present

## 2020-09-22 IMAGING — MG DIGITAL SCREENING BILAT W/ TOMO W/ CAD
8 series · 9 of 24 positions shown · non-contrast
Comparison: Previous exam(s).

CLINICAL DATA: Screening.

EXAM:
DIGITAL SCREENING BILATERAL MAMMOGRAM WITH TOMO AND CAD

[L MLO synth-2D]
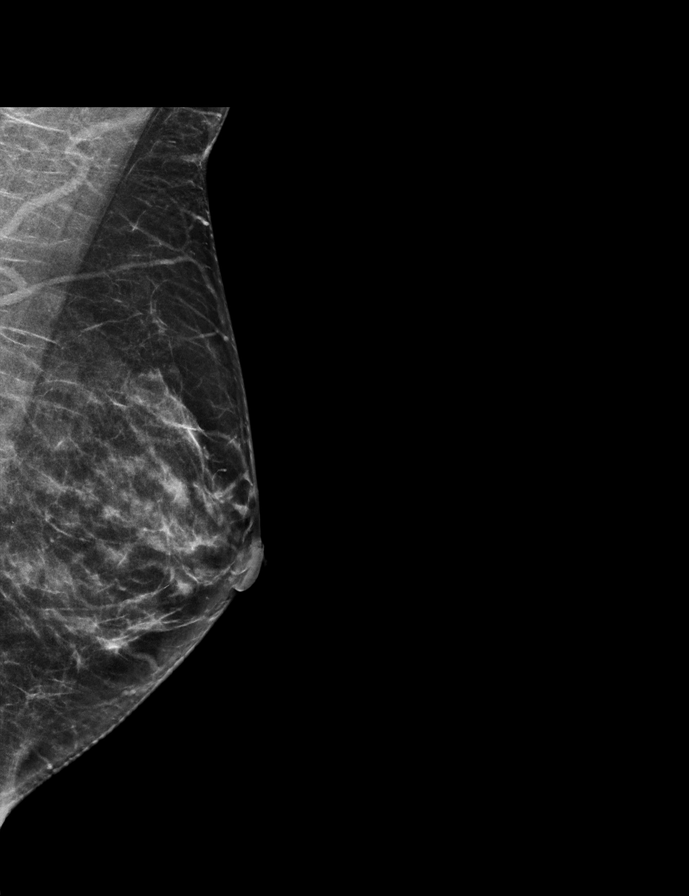

[L CC synth-2D]
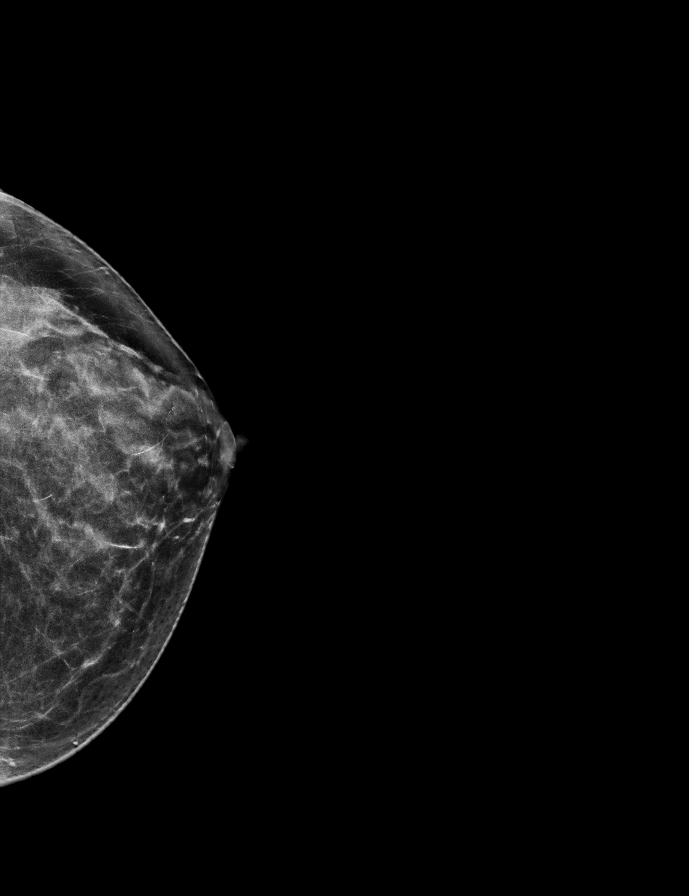

[R MLO synth-2D]
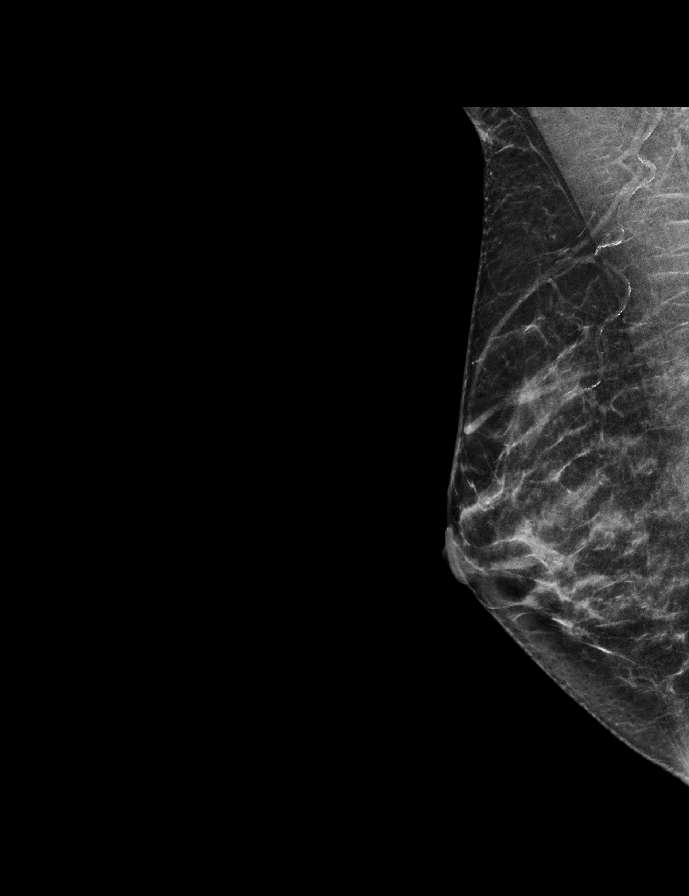

[R CC synth-2D]
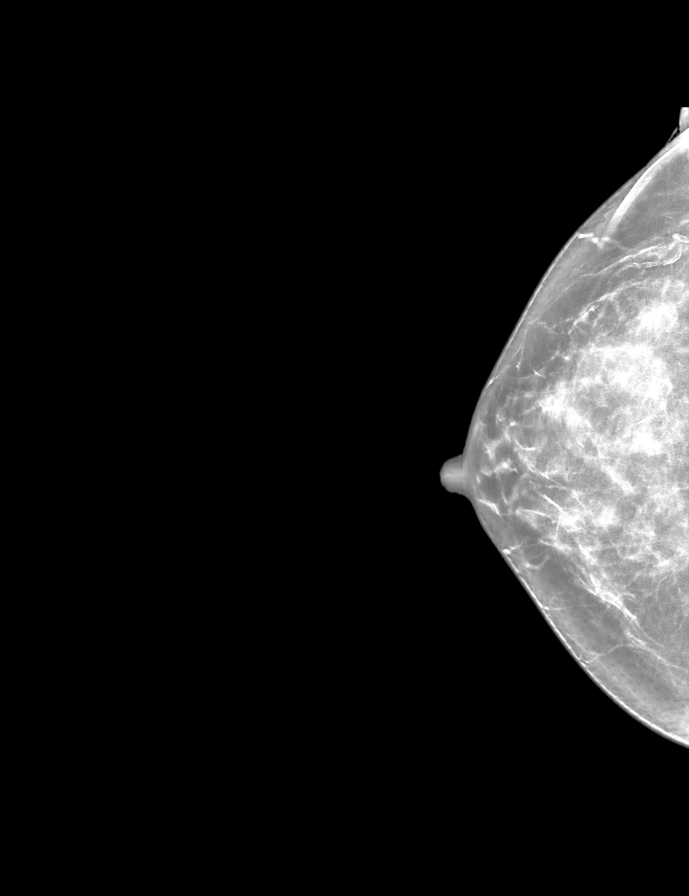

[L CC tomo · 2 of 60 frames shown]
[frame 20/60]
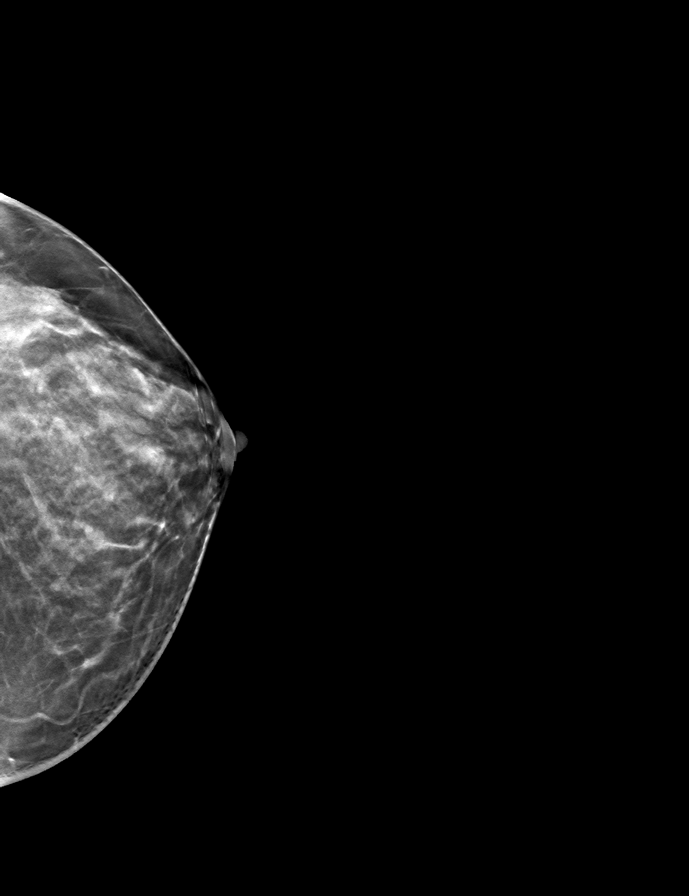
[frame 31/60]
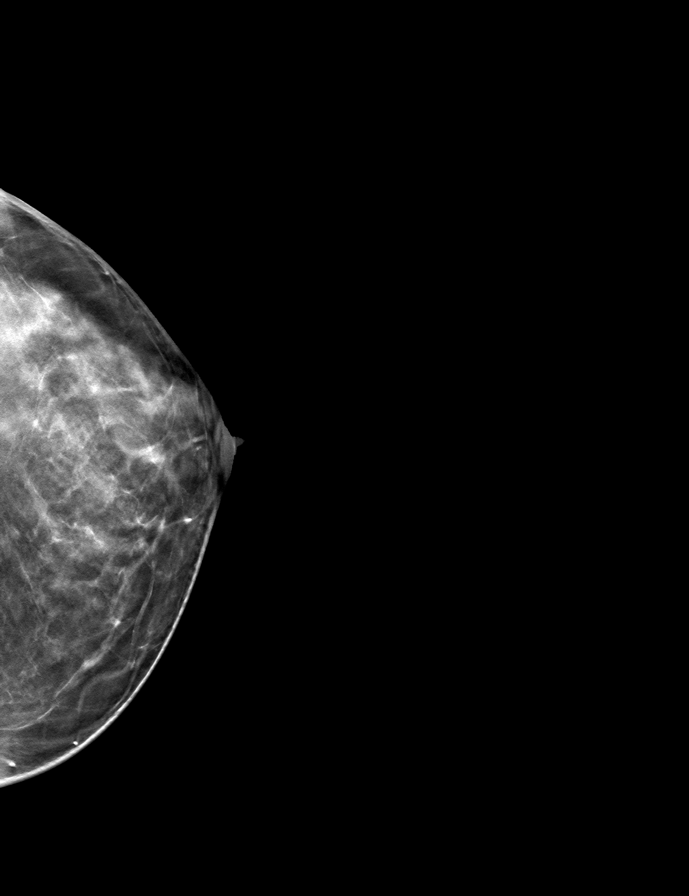

[L MLO tomo · tomo slice 29/58.0]
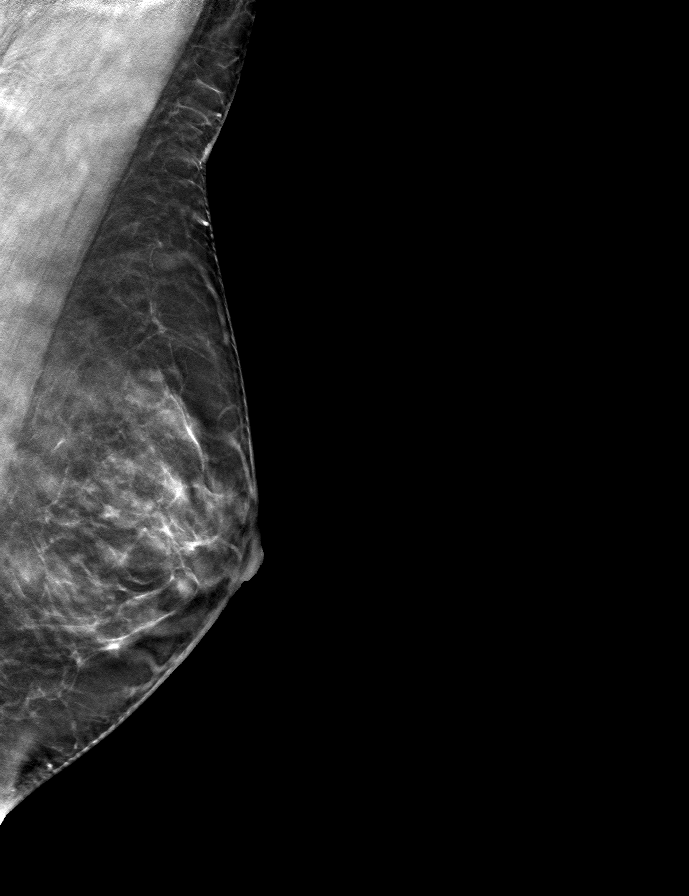

[R CC tomo · tomo slice 29/58.0]
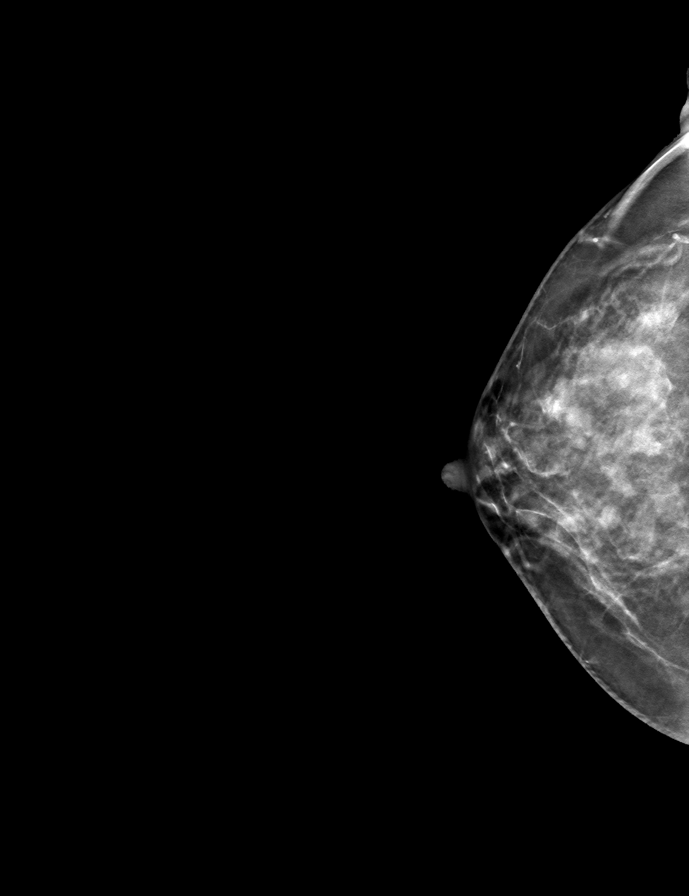

[R MLO tomo · tomo slice 28/55.0]
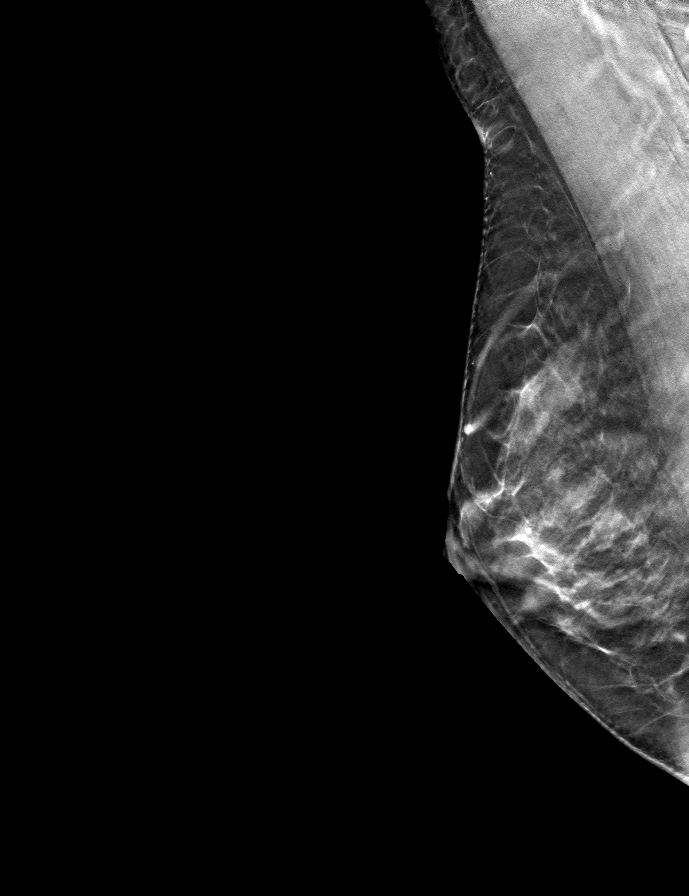

[9 of 24 positions shown; findings below may reference images not displayed]

ACR Breast Density Category c: The breast tissue is heterogeneously
dense, which may obscure small masses.
FINDINGS: There are no findings suspicious for malignancy. Images were
processed with CAD.
IMPRESSION: No mammographic evidence of malignancy. A result letter of this
screening mammogram will be mailed directly to the patient.

RECOMMENDATION:
Screening mammogram in one year. (Code:FT-U-LHB)

BI-RADS CATEGORY  1: Negative.

## 2020-10-30 ENCOUNTER — Other Ambulatory Visit: Payer: Self-pay | Admitting: Obstetrics & Gynecology

## 2020-10-30 DIAGNOSIS — Z1231 Encounter for screening mammogram for malignant neoplasm of breast: Secondary | ICD-10-CM

## 2020-11-10 ENCOUNTER — Ambulatory Visit: Payer: BC Managed Care – PPO

## 2020-11-10 ENCOUNTER — Ambulatory Visit: Payer: BC Managed Care – PPO | Admitting: Certified Nurse Midwife

## 2020-12-15 ENCOUNTER — Inpatient Hospital Stay: Admission: RE | Admit: 2020-12-15 | Payer: BC Managed Care – PPO | Source: Ambulatory Visit

## 2020-12-19 ENCOUNTER — Ambulatory Visit (HOSPITAL_BASED_OUTPATIENT_CLINIC_OR_DEPARTMENT_OTHER): Payer: BC Managed Care – PPO | Admitting: Obstetrics & Gynecology

## 2020-12-19 ENCOUNTER — Ambulatory Visit: Payer: BC Managed Care – PPO

## 2021-02-12 ENCOUNTER — Ambulatory Visit (INDEPENDENT_AMBULATORY_CARE_PROVIDER_SITE_OTHER): Payer: BC Managed Care – PPO | Admitting: Obstetrics & Gynecology

## 2021-02-12 ENCOUNTER — Other Ambulatory Visit: Payer: Self-pay

## 2021-02-12 ENCOUNTER — Encounter (HOSPITAL_BASED_OUTPATIENT_CLINIC_OR_DEPARTMENT_OTHER): Payer: Self-pay | Admitting: Obstetrics & Gynecology

## 2021-02-12 VITALS — BP 176/83 | HR 74 | Ht 65.5 in | Wt 169.2 lb

## 2021-02-12 DIAGNOSIS — R935 Abnormal findings on diagnostic imaging of other abdominal regions, including retroperitoneum: Secondary | ICD-10-CM

## 2021-02-12 DIAGNOSIS — Z01419 Encounter for gynecological examination (general) (routine) without abnormal findings: Secondary | ICD-10-CM

## 2021-02-12 DIAGNOSIS — N840 Polyp of corpus uteri: Secondary | ICD-10-CM | POA: Diagnosis not present

## 2021-02-12 DIAGNOSIS — N926 Irregular menstruation, unspecified: Secondary | ICD-10-CM | POA: Diagnosis not present

## 2021-02-12 NOTE — Progress Notes (Addendum)
55 y.o. G0P0000 Single White or Caucasian female here for annual exam.  Pt has hx of irregular bleeding.  She was seen last year by Dr. Sumner Boast and had and ultrasound showing possible left dermoid and endometrial polyp.  Ca-125 was normal.  FSH were 55 and then decreased to a normal value.  Pt didn't proceed with surgery as timing was just not good.  Helping to care for aging parent.  Wants my opinion about proceeding with surgery as well as does she need any additional testing.  Does continue to have irregular bleeding.    Patient's last menstrual period was 09/10/2020 (approximate).          Sexually active: No.  The current method of family planning is abstinence.    Exercising: League tennis Smoker:  no  Health Maintenance: Pap:  2021 History of abnormal Pap:  no MMG:  Scheduled 03/2021 Colonoscopy:  2020 due this year BMD:   12/2020 normal TDaP:  10/30/2014 Shingrix:   11/04/2020 Hep C testing: 11/04/2020 Screening Labs: done with Ardith Dark, PCP   reports that she has quit smoking. She has never used smokeless tobacco. She reports previous alcohol use. She reports that she does not use drugs.  Past Medical History:  Diagnosis Date   Allergy    Anxiety    in the past   Thoracic outlet syndrome    Ulcerative colitis ([place])     Past Surgical History:  Procedure Laterality Date   FIRST RIB REMOVAL     for thoracic outlet syndrome    Current Outpatient Medications  Medication Sig Dispense Refill   Ascorbic Acid (VITAMIN C PO) Take by mouth.     B Complex Vitamins (B COMPLEX PO) Take by mouth.     cetirizine (ZYRTEC) 10 MG tablet Take 10 mg by mouth as needed.      Cholecalciferol (VITAMIN D PO) Take 1,000 Int'l Units by mouth daily.      mesalamine (LIALDA) 1.2 g EC tablet 2 (two) times daily.      metoprolol succinate (TOPROL-XL) 25 MG 24 hr tablet Take 25 mg by mouth daily.     perindopril (ACEON) 8 MG tablet TAKE 1/2-1 TABLET BY MOUTH DAILY FOR BLOOD PRESSURE      Probiotic Product (PROBIOTIC PO) Take by mouth. Ultra flora IB (Patient not taking: Reported on 02/12/2021)     No current facility-administered medications for this visit.    Family History  Problem Relation Age of Onset   Heart disease Mother    Anxiety disorder Mother    Depression Mother        severe depression   Heart disease Father        chf   Hypertension Father    Heart attack Father    Breast cancer Maternal Grandmother        age 48's   Heart attack Maternal Grandmother     Review of Systems  Genitourinary: Positive for vaginal bleeding.    Exam:   BP (!) 176/83 (BP Location: Right Arm, Patient Position: Sitting, Cuff Size: Large)   Pulse 74   Ht 5' 5.5" (1.664 m)   Wt 169 lb 3.2 oz (76.7 kg)   LMP 09/10/2020 (Approximate)   BMI 27.73 kg/m   Height: 5' 5.5" (166.4 cm)  General appearance: alert, cooperative and appears stated age Head: Normocephalic, without obvious abnormality, atraumatic Neck: no adenopathy, supple, symmetrical, trachea midline and thyroid normal to inspection and palpation Lungs: clear to auscultation bilaterally Breasts:  normal appearance, no masses or tenderness Heart: regular rate and rhythm Abdomen: soft, non-tender; bowel sounds normal; no masses,  no organomegaly Extremities: extremities normal, atraumatic, no cyanosis or edema Skin: Skin color, texture, turgor normal. No rashes or lesions Lymph nodes: Cervical, supraclavicular, and axillary nodes normal. No abnormal inguinal nodes palpated Neurologic: Grossly normal   Pelvic: External genitalia:  no lesions              Urethra:  normal appearing urethra with no masses, tenderness or lesions              Bartholins and Skenes: normal                 Vagina: normal appearing vagina with normal color and no discharge, no lesions              Cervix: no lesions              Pap taken: No. Bimanual Exam:  Uterus:  normal size, contour, position, consistency, mobility,  non-tender              Adnexa: normal adnexa and no mass, fullness, tenderness               Rectovaginal: Confirms               Anus:  normal sphincter tone, no lesions  Chaperone, Octaviano Batty, CMA, was present for exam.  Assessment/Plan: 1. Well woman exam with routine gynecological exam - Pap normal 2021.  Not indicated today - MMG scheduled 03/2021 - colonoscopy 2020.  Due later this year. - vaccines reviewed - lab work done with Ardith Dark, PCP  2. Irregular bleeding - ultrasound complete with transvaginal imaging ordered - will have consult with me after ultrasound to review images and make additional recommendations.    3. Abnormal ultrasound of ovary  4. Endometrial polyp

## 2021-02-13 ENCOUNTER — Other Ambulatory Visit (HOSPITAL_BASED_OUTPATIENT_CLINIC_OR_DEPARTMENT_OTHER): Payer: Self-pay | Admitting: Obstetrics & Gynecology

## 2021-02-13 DIAGNOSIS — R935 Abnormal findings on diagnostic imaging of other abdominal regions, including retroperitoneum: Secondary | ICD-10-CM

## 2021-02-13 DIAGNOSIS — N926 Irregular menstruation, unspecified: Secondary | ICD-10-CM

## 2021-02-14 DIAGNOSIS — R935 Abnormal findings on diagnostic imaging of other abdominal regions, including retroperitoneum: Secondary | ICD-10-CM | POA: Insufficient documentation

## 2021-02-14 DIAGNOSIS — N840 Polyp of corpus uteri: Secondary | ICD-10-CM | POA: Insufficient documentation

## 2021-02-14 DIAGNOSIS — N926 Irregular menstruation, unspecified: Secondary | ICD-10-CM | POA: Insufficient documentation

## 2021-02-17 ENCOUNTER — Ambulatory Visit (HOSPITAL_BASED_OUTPATIENT_CLINIC_OR_DEPARTMENT_OTHER): Payer: BC Managed Care – PPO | Admitting: Obstetrics & Gynecology

## 2021-03-20 ENCOUNTER — Other Ambulatory Visit: Payer: Self-pay

## 2021-03-20 ENCOUNTER — Ambulatory Visit (HOSPITAL_BASED_OUTPATIENT_CLINIC_OR_DEPARTMENT_OTHER)
Admission: RE | Admit: 2021-03-20 | Discharge: 2021-03-20 | Disposition: A | Discharge status: Home / Self Care | Payer: BC Managed Care – PPO | Source: Ambulatory Visit | Attending: Obstetrics & Gynecology | Admitting: Obstetrics & Gynecology

## 2021-03-20 DIAGNOSIS — N926 Irregular menstruation, unspecified: Secondary | ICD-10-CM | POA: Diagnosis present

## 2021-03-20 DIAGNOSIS — R935 Abnormal findings on diagnostic imaging of other abdominal regions, including retroperitoneum: Secondary | ICD-10-CM | POA: Diagnosis present

## 2021-03-25 ENCOUNTER — Encounter (HOSPITAL_BASED_OUTPATIENT_CLINIC_OR_DEPARTMENT_OTHER): Payer: Self-pay

## 2021-03-26 ENCOUNTER — Ambulatory Visit
Admission: RE | Admit: 2021-03-26 | Discharge: 2021-03-26 | Disposition: A | Discharge status: Home / Self Care | Payer: BC Managed Care – PPO | Source: Ambulatory Visit | Attending: Obstetrics & Gynecology | Admitting: Obstetrics & Gynecology

## 2021-03-26 ENCOUNTER — Other Ambulatory Visit: Payer: Self-pay

## 2021-03-26 DIAGNOSIS — Z1231 Encounter for screening mammogram for malignant neoplasm of breast: Secondary | ICD-10-CM

## 2021-03-27 ENCOUNTER — Encounter (HOSPITAL_BASED_OUTPATIENT_CLINIC_OR_DEPARTMENT_OTHER): Payer: Self-pay | Admitting: Obstetrics & Gynecology

## 2021-03-27 ENCOUNTER — Ambulatory Visit (INDEPENDENT_AMBULATORY_CARE_PROVIDER_SITE_OTHER): Payer: BC Managed Care – PPO | Admitting: Obstetrics & Gynecology

## 2021-03-27 ENCOUNTER — Other Ambulatory Visit (HOSPITAL_BASED_OUTPATIENT_CLINIC_OR_DEPARTMENT_OTHER)
Admission: RE | Admit: 2021-03-27 | Discharge: 2021-03-27 | Disposition: A | Discharge status: Home / Self Care | Payer: BC Managed Care – PPO | Source: Ambulatory Visit | Attending: Obstetrics & Gynecology | Admitting: Obstetrics & Gynecology

## 2021-03-27 VITALS — BP 146/76 | HR 68 | Wt 162.0 lb

## 2021-03-27 DIAGNOSIS — C562 Malignant neoplasm of left ovary: Secondary | ICD-10-CM | POA: Insufficient documentation

## 2021-03-27 DIAGNOSIS — N926 Irregular menstruation, unspecified: Secondary | ICD-10-CM | POA: Insufficient documentation

## 2021-03-27 DIAGNOSIS — D271 Benign neoplasm of left ovary: Secondary | ICD-10-CM

## 2021-03-27 DIAGNOSIS — R9389 Abnormal findings on diagnostic imaging of other specified body structures: Secondary | ICD-10-CM

## 2021-03-28 LAB — FOLLICLE STIMULATING HORMONE: FSH: 29.5 m[IU]/mL

## 2021-03-28 LAB — CA 125: Cancer Antigen (CA) 125: 7.2 U/mL (ref 0.0–38.1)

## 2021-03-29 DIAGNOSIS — C562 Malignant neoplasm of left ovary: Secondary | ICD-10-CM | POA: Insufficient documentation

## 2021-03-29 NOTE — Progress Notes (Signed)
GYNECOLOGY  VISIT  CC:   discuss ultrasound results  HPI: 55 y.o. G0P0000 Single White or Caucasian female here for discussion of ultrasound results.  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  Review of prior ultrasound showed endometrium is now thickened more.  SHGM was done last year and showed a polyp so I am do not feel the bicornuate reading of ultrasound is correct.  Imagines from last year and this year reviewed with pt and shown to pt.  Complex left ovary is not normal and at her age really should be removed.  Feel should also proceed with hysteroscopy with resection and/or D&C of endometrial thickening.  [facility]  Procedure discussed with patient.  Hospital stay, recovery and pain management all discussed.  Risks discussed including but not limited to bleeding, <1% risk of receiving a  transfusion, infection, 1-2% risk of bowel/bladder/ureteral/vascular injury discussed as well as possible need for additional surgery if injury does occur discussed.  DVT/PE and rare risk of death discussed.  My actual complications with prior surgeries discussed.  Hernia formation discussed.  Positioning and incision locations discussed.  Patient aware if pathology abnormal she may need additional treatment.  All questions answered.  Pt is considering delaying until later in the summer which I think is ok unless ca-125 is abnormal.    Last pap smear:  01/16/2020 MMG:  03/26/2021 with results pending Contraception:  abstinence    Patient Active Problem List   Diagnosis Date Noted   Abnormal ultrasound of ovary 02/14/2021   Irregular bleeding 02/14/2021   Endometrial polyp 02/14/2021    Past Medical History:  Diagnosis Date   Allergy    Anxiety    in the past   Thoracic outlet syndrome    Ulcerative colitis ([facility]      Past Surgical History:  Procedure Laterality Date   FIRST RIB REMOVAL Left 01/23/2009   for thoracic outlet syndrome    MEDS:   Current Outpatient Medications on File Prior to Visit  Medication Sig Dispense Refill   Ascorbic Acid (VITAMIN C PO) Take by mouth.     B Complex Vitamins (B COMPLEX PO) Take by mouth.     cetirizine (ZYRTEC) 10 MG tablet Take 10 mg by mouth as needed.      Cholecalciferol (VITAMIN D PO) Take 1,000 Int'l Units by mouth daily.      mesalamine (LIALDA) 1.2 g EC tablet 2 (two) times daily.      metoprolol succinate (TOPROL-XL) 25 MG 24 hr tablet Take 25 mg by mouth daily.     perindopril (ACEON) 8 MG tablet TAKE 1/2-1 TABLET BY MOUTH DAILY FOR BLOOD PRESSURE     Probiotic Product (PROBIOTIC PO) Take by mouth. Ultra flora IB (Patient not taking: Reported on 02/12/2021)     No current facility-administered medications on file prior to visit.    ALLERGIES: Patient has no known allergies.  Family History  Problem Relation Age of Onset   Heart disease Mother    Anxiety disorder Mother    Depression Mother        severe depression   Heart disease Father        chf   Hypertension Father    Heart attack Father    Breast cancer Maternal Grandmother        age 852's  Heart attack Maternal Grandmother    SH:  divorced, non smoker  Review of Systems  All other systems reviewed and are negative.  PHYSICAL EXAMINATION:    BP (!) 146/76   Pulse 68   Wt 162 lb (73.5 kg)   BMI 26.55 kg/m     General appearance: alert, cooperative and appears stated age No other exam performed today  Assessment/Plan: 1. Irregular bleeding - will check perimenopausal/menopausal status as ultrasound does still look like there is ovarian function - Follicle stimulating hormone; Future  2. Ovarian malignant neoplasm, left (HCC) - CA 125; Future  3. Thickened endometrium - probable polyp.  Hysteroscopy with polyp resection, D&C and laparoscopic LSO has been  recommended.  Will send information for surgery scheduling.  Pt asked about timing and is aware, if ca-125 is normal, it is ok to delay until later in the summer.  Total time with pt and documentation:  33 minutes

## 2021-03-30 ENCOUNTER — Telehealth: Payer: Self-pay | Admitting: *Deleted

## 2021-03-30 ENCOUNTER — Other Ambulatory Visit: Payer: Self-pay | Admitting: Obstetrics & Gynecology

## 2021-03-30 DIAGNOSIS — R928 Other abnormal and inconclusive findings on diagnostic imaging of breast: Secondary | ICD-10-CM

## 2021-03-30 NOTE — Telephone Encounter (Signed)
Call to patient regarding surgery date options. Patient declines to schedule at this time.  Has abnormal mammogram result from last week and waiting to schedule follow-up. Wants to have this cleared up and then schedule surgery.   Advised will follow-up in one month if she has not called [place].  Also advised surgery scheduling need approximately 6 weeks notice to schedule.   Routing to provider- FYI. Encounter closed.

## 2021-04-03 ENCOUNTER — Other Ambulatory Visit: Payer: BC Managed Care – PPO

## 2021-04-04 ENCOUNTER — Other Ambulatory Visit: Payer: Self-pay | Admitting: Obstetrics & Gynecology

## 2021-04-04 ENCOUNTER — Other Ambulatory Visit: Payer: BC Managed Care – PPO

## 2021-04-04 ENCOUNTER — Ambulatory Visit
Admission: RE | Admit: 2021-04-04 | Discharge: 2021-04-04 | Disposition: A | Discharge status: Home / Self Care | Payer: BC Managed Care – PPO | Source: Ambulatory Visit | Attending: Obstetrics & Gynecology | Admitting: Obstetrics & Gynecology

## 2021-04-04 ENCOUNTER — Other Ambulatory Visit: Payer: Self-pay

## 2021-04-04 DIAGNOSIS — R928 Other abnormal and inconclusive findings on diagnostic imaging of breast: Secondary | ICD-10-CM

## 2021-04-04 DIAGNOSIS — N6320 Unspecified lump in the left breast, unspecified quadrant: Secondary | ICD-10-CM

## 2021-04-06 ENCOUNTER — Encounter (HOSPITAL_BASED_OUTPATIENT_CLINIC_OR_DEPARTMENT_OTHER): Payer: Self-pay

## 2021-04-16 ENCOUNTER — Other Ambulatory Visit: Payer: Self-pay | Admitting: Obstetrics & Gynecology

## 2021-04-16 DIAGNOSIS — N6320 Unspecified lump in the left breast, unspecified quadrant: Secondary | ICD-10-CM

## 2021-04-29 ENCOUNTER — Other Ambulatory Visit: Payer: Self-pay | Admitting: Obstetrics & Gynecology

## 2021-04-29 ENCOUNTER — Other Ambulatory Visit: Payer: Self-pay

## 2021-04-29 ENCOUNTER — Ambulatory Visit
Admission: RE | Admit: 2021-04-29 | Discharge: 2021-04-29 | Disposition: A | Discharge status: Home / Self Care | Payer: BC Managed Care – PPO | Source: Ambulatory Visit | Attending: Obstetrics & Gynecology | Admitting: Obstetrics & Gynecology

## 2021-04-29 DIAGNOSIS — N6320 Unspecified lump in the left breast, unspecified quadrant: Secondary | ICD-10-CM

## 2021-06-30 ENCOUNTER — Telehealth: Payer: Self-pay | Admitting: *Deleted

## 2021-06-30 ENCOUNTER — Encounter (HOSPITAL_BASED_OUTPATIENT_CLINIC_OR_DEPARTMENT_OTHER): Payer: Self-pay

## 2021-06-30 NOTE — Telephone Encounter (Signed)
Call to patient. Surgery date options and post-op surgery restrictions reviewed. Discussed anesthesia and pre-op appointments.  Patient aware will need at least 1 and possibly 2 full weeks recovery.  Considering options and will contact back after checking with employer.

## 2021-07-01 NOTE — Telephone Encounter (Signed)
Patient left voice mail message requesting to proceed on 09-15-21. Will schedule and call patient once confirmed.

## 2021-07-14 NOTE — Telephone Encounter (Signed)
Call to patient- voice mail message confirms first and last name. Left message advising procedure scheduled for 12-6 at 1100 at [facility] as previously discussed. Arrival at 9 am and will send instruction through My Chart.   My Chart instructions sent. Encounter closed.

## 2021-07-17 ENCOUNTER — Other Ambulatory Visit (HOSPITAL_BASED_OUTPATIENT_CLINIC_OR_DEPARTMENT_OTHER): Payer: Self-pay | Admitting: Obstetrics & Gynecology

## 2021-07-17 DIAGNOSIS — Z01818 Encounter for other preprocedural examination: Secondary | ICD-10-CM

## 2021-08-26 HISTORY — PX: COLONOSCOPY: SHX174

## 2021-08-28 ENCOUNTER — Ambulatory Visit (INDEPENDENT_AMBULATORY_CARE_PROVIDER_SITE_OTHER): Payer: BC Managed Care – PPO | Admitting: Obstetrics & Gynecology

## 2021-08-28 ENCOUNTER — Other Ambulatory Visit: Payer: Self-pay

## 2021-08-28 ENCOUNTER — Encounter (HOSPITAL_BASED_OUTPATIENT_CLINIC_OR_DEPARTMENT_OTHER): Payer: Self-pay | Admitting: Obstetrics & Gynecology

## 2021-08-28 VITALS — BP 162/77 | HR 66 | Ht 66.5 in | Wt 168.8 lb

## 2021-08-28 DIAGNOSIS — C562 Malignant neoplasm of left ovary: Secondary | ICD-10-CM | POA: Diagnosis not present

## 2021-08-28 DIAGNOSIS — R9389 Abnormal findings on diagnostic imaging of other specified body structures: Secondary | ICD-10-CM | POA: Diagnosis not present

## 2021-08-28 DIAGNOSIS — Z23 Encounter for immunization: Secondary | ICD-10-CM

## 2021-08-28 DIAGNOSIS — N840 Polyp of corpus uteri: Secondary | ICD-10-CM

## 2021-08-28 DIAGNOSIS — I10 Essential (primary) hypertension: Secondary | ICD-10-CM

## 2021-09-04 DIAGNOSIS — I10 Essential (primary) hypertension: Secondary | ICD-10-CM | POA: Insufficient documentation

## 2021-09-04 NOTE — Progress Notes (Signed)
55 y.o. G0P0000 Single White or Caucasian female here for discussion of update of history and discussion of upcoming procedure.  She has not been seen since 03/27/2021.  Hysteroscopy with removal of endometrial lesions recommended as well as LSO and possible right salpingectomy due to irregular bleeding, endometrial lesions c/w polyps and left dermoid.  Two normal ca-125's have been obtained.  Pt has delayed due to needing conformation that ovarian lesion was persistent and then she had an abnormal mammogram that required biopsy showing a fibroadenoma.  During the last six months she has also been seen by her primary card doctor.     Procedure discussed with patient.  Hospital stay, recovery and pain management all discussed.  Risks discussed including but not limited to bleeding, <1% risk of receiving a transfusion, infection, <2% risk of bowel/bladder/ureteral/vascular injury discussed as well as possible need for additional surgery if injury does occur discussed.  DVT/PE and rare risk of death discussed.  My actual complications with prior surgeries discussed.  Positioning and incision locations discussed.  Patient aware if pathology abnormal she may need additional treatment.  All questions answered.     Ob Hx:   Patient's last menstrual period was 07/25/2021.          Sexually active: No. Birth control: abstinence Last pap: 01/2020 Last MMG: 03/2021 Smoking: No   Past Surgical History:  Procedure Laterality Date   FIRST RIB REMOVAL Left 01/23/2009   for thoracic outlet syndrome    Past Medical History:  Diagnosis Date   Allergy    Anxiety    in the past   Thoracic outlet syndrome    Ulcerative colitis ([place])     Allergies: Patient has no known allergies.  Current Outpatient Medications  Medication Sig Dispense Refill   Ascorbic Acid (VITAMIN C PO) Take by mouth.     B Complex Vitamins (B COMPLEX PO) Take by mouth.     cetirizine (ZYRTEC) 10 MG tablet Take 10 mg by mouth as needed.       Cholecalciferol (VITAMIN D PO) Take 1,000 Int'l Units by mouth daily.      mesalamine (LIALDA) 1.2 g EC tablet 2 (two) times daily.      perindopril (ACEON) 8 MG tablet TAKE 1/2-1 TABLET BY MOUTH DAILY FOR BLOOD PRESSURE     metoprolol succinate (TOPROL-XL) 25 MG 24 hr tablet Take 25 mg by mouth daily. (Patient not taking: Reported on 08/28/2021)     Probiotic Product (PROBIOTIC PO) Take by mouth. Ultra flora IB (Patient not taking: Reported on 02/12/2021)     No current facility-administered medications for this visit.    ROS: Pertinent items noted in HPI and remainder of comprehensive ROS otherwise negative.  Exam:   BP (!) 162/77 (BP Location: Right Arm, Patient Position: Sitting, Cuff Size: Large)   Pulse 66   Ht 5' 6.5" (1.689 m) Comment: reported  Wt 168 lb 12.8 oz (76.6 kg)   LMP 07/25/2021   BMI 26.84 kg/m   General appearance: alert and cooperative Head: Normocephalic, without obvious abnormality, atraumatic Neck: no adenopathy, supple, symmetrical, trachea midline and thyroid not enlarged, symmetric, no tenderness/mass/nodules Lungs: clear to auscultation bilaterally Heart: regular rate and rhythm, S1, S2 normal, no murmur, click, rub or gallop Abdomen: soft, non-tender; bowel sounds normal; no masses,  no organomegaly Extremities: extremities normal, atraumatic, no cyanosis or edema Skin: Skin color, texture, turgor normal. No rashes or lesions Lymph nodes: Cervical, supraclavicular, and axillary nodes normal. no inguinal nodes palpated Neurologic:  Grossly normal  Pelvic: deferred  Assessment/Plan: 1. Thickened endometrium - hysteroscopy with possible polyp resection, D&C planned  2. Endometrial polyp  3. Ovarian malignant neoplasm, left (HCC) - laparoscopic LSO with right salpingectomy, possible left ovarian cystectomy planned - Pre and post op instructions reviewed - Post op pain medications reviewed - Medications/Vitamins reviewed.

## 2021-09-09 NOTE — Progress Notes (Signed)
Pt called today and stated she has symptoms of covid/ flu with body aches, feels real bad, and fever that started 09-07-2021. Today her fever is 99.9. pt surgery is scheduled on 09-15-2021 @[facility] .  Pt given the option of doing home covid test or covid test with [facility].  Pt decided to to covid test with [facility] for surgery.  Pt to go Friday 09-11-2021 between 0800 to 1430. Pt verbalized understanding if positive her surgery will need to rescheduled after 10 days of symptom onset and if negative she would need to be symptom free for 48 hours prior to surgery.

## 2021-09-10 ENCOUNTER — Encounter (HOSPITAL_BASED_OUTPATIENT_CLINIC_OR_DEPARTMENT_OTHER): Payer: Self-pay | Admitting: Obstetrics & Gynecology

## 2021-09-10 ENCOUNTER — Other Ambulatory Visit: Payer: Self-pay

## 2021-09-10 NOTE — Progress Notes (Addendum)
ADDENDUM:  Noted in epic today 09-14-2021 pt covid test negative and noted she had ED visit 09-12-2021 in epic with fever, n/v, diarrhea. Pt had left voice message early this morning about rescheduling her surgery. Called and spoke w/ pt, stated she still has fever and feels like " I been hit by a trunk".  She stated she sent my chart message to office to reschedule and I will send inbox message in epic to Dr Sabra Heck and OR scheduler, Judson Roch Gay Filler) , the office will call her about rescheduling.   ADDENDUM:  per [facility] pt to have covid test 09-11-2021 and pt to be 48 hour fever free without taking tylenol prior to surgery to proceed.   ADDENDUM to progress note from 09-09-2021 about pt having fever.  Pt stated today , 09-10-2021, still has fever/ body aches/ headache,  fever today 100 to 101.  Pt stated she had televisit with her pcp today and covid/ flu test done and were negative. Stated she was told has virus and take tylenol. Will discuss this pt's case with [facility] AD @[facility]  about recommendations to proceed with surgery.  Pt will need call back with recommendation.   Spoke w/ via phone for pre-op interview--- pt Lab needs dos----  cbc, bmp, t&s, urine preg, ekg             Lab results------ when available in care everywhere results from covid/ flu done today  COVID test -- see above note Arrive at ------- 0830 on 09-15-2021 NPO after MN NO Solid Food.  Clear liquids from MN until--- 0730 Med rec completed Medications to take morning of surgery ----- none Diabetic medication ----- n/a Patient instructed no nail polish to be worn day of surgery Patient instructed to bring photo id and insurance card day of surgery Patient aware to have Driver (ride ) --niece, Holiday representative caregiver for 24 hours after surgery --sister, Technical brewer Patient Special Instructions ----- n/a Pre-Op special Istructions ----- n/a Patient verbalized understanding of instructions that were given at  this phone interview. Patient denies shortness of breath, chest pain, fever, cough at this phone interview.

## 2021-09-11 ENCOUNTER — Encounter (HOSPITAL_COMMUNITY): Payer: Self-pay

## 2021-09-11 ENCOUNTER — Inpatient Hospital Stay (HOSPITAL_COMMUNITY): Admission: RE | Admit: 2021-09-11 | Payer: BC Managed Care – PPO | Source: Ambulatory Visit

## 2021-09-11 ENCOUNTER — Other Ambulatory Visit: Payer: Self-pay | Admitting: Obstetrics & Gynecology

## 2021-09-12 ENCOUNTER — Other Ambulatory Visit: Payer: Self-pay

## 2021-09-12 ENCOUNTER — Encounter (HOSPITAL_BASED_OUTPATIENT_CLINIC_OR_DEPARTMENT_OTHER): Payer: Self-pay | Admitting: *Deleted

## 2021-09-12 ENCOUNTER — Emergency Department (HOSPITAL_BASED_OUTPATIENT_CLINIC_OR_DEPARTMENT_OTHER)
Admission: EM | Admit: 2021-09-12 | Discharge: 2021-09-12 | Disposition: A | Discharge status: Home / Self Care | Payer: BC Managed Care – PPO | Attending: Emergency Medicine | Admitting: Emergency Medicine

## 2021-09-12 ENCOUNTER — Emergency Department (HOSPITAL_BASED_OUTPATIENT_CLINIC_OR_DEPARTMENT_OTHER): Payer: BC Managed Care – PPO

## 2021-09-12 DIAGNOSIS — Z20822 Contact with and (suspected) exposure to covid-19: Secondary | ICD-10-CM | POA: Insufficient documentation

## 2021-09-12 DIAGNOSIS — Z79899 Other long term (current) drug therapy: Secondary | ICD-10-CM | POA: Insufficient documentation

## 2021-09-12 DIAGNOSIS — R509 Fever, unspecified: Secondary | ICD-10-CM | POA: Insufficient documentation

## 2021-09-12 DIAGNOSIS — Z8543 Personal history of malignant neoplasm of ovary: Secondary | ICD-10-CM | POA: Insufficient documentation

## 2021-09-12 DIAGNOSIS — E871 Hypo-osmolality and hyponatremia: Secondary | ICD-10-CM | POA: Diagnosis not present

## 2021-09-12 DIAGNOSIS — F17210 Nicotine dependence, cigarettes, uncomplicated: Secondary | ICD-10-CM | POA: Diagnosis not present

## 2021-09-12 DIAGNOSIS — I10 Essential (primary) hypertension: Secondary | ICD-10-CM | POA: Diagnosis not present

## 2021-09-12 DIAGNOSIS — E876 Hypokalemia: Secondary | ICD-10-CM | POA: Diagnosis not present

## 2021-09-12 LAB — COMPREHENSIVE METABOLIC PANEL
ALT: 33 U/L (ref 0–44)
AST: 32 U/L (ref 15–41)
Albumin: 3.8 g/dL (ref 3.5–5.0)
Alkaline Phosphatase: 83 U/L (ref 38–126)
Anion gap: 12 (ref 5–15)
BUN: 12 mg/dL (ref 6–20)
CO2: 21 mmol/L — ABNORMAL LOW (ref 22–32)
Calcium: 9.1 mg/dL (ref 8.9–10.3)
Chloride: 96 mmol/L — ABNORMAL LOW (ref 98–111)
Creatinine, Ser: 0.68 mg/dL (ref 0.44–1.00)
GFR, Estimated: >60 mL/min (ref >60)
Glucose, Bld: 173 mg/dL — ABNORMAL HIGH (ref 70–99)
Potassium: 3.3 mmol/L — ABNORMAL LOW (ref 3.5–5.1)
Sodium: 129 mmol/L — ABNORMAL LOW (ref 135–145)
Total Bilirubin: 0.4 mg/dL (ref 0.3–1.2)
Total Protein: 7.1 g/dL (ref 6.5–8.1)

## 2021-09-12 LAB — URINALYSIS, MICROSCOPIC (REFLEX)

## 2021-09-12 LAB — RESP PANEL BY RT-PCR (FLU A&B, COVID) ARPGX2
Influenza A by PCR: NEGATIVE
Influenza B by PCR: NEGATIVE
SARS Coronavirus 2 by RT PCR: NEGATIVE

## 2021-09-12 LAB — CBC WITH DIFFERENTIAL/PLATELET
Abs Immature Granulocytes: 0.06 10*3/uL (ref 0.00–0.07)
Basophils Absolute: 0.1 10*3/uL (ref 0.0–0.1)
Basophils Relative: 1 %
Eosinophils Absolute: 0 10*3/uL (ref 0.0–0.5)
Eosinophils Relative: 0 %
HCT: 41.1 % (ref 36.0–46.0)
Hemoglobin: 14.4 g/dL (ref 12.0–15.0)
Immature Granulocytes: 1 %
Lymphocytes Relative: 9 %
Lymphs Abs: 1 10*3/uL (ref 0.7–4.0)
MCH: 30.6 pg (ref 26.0–34.0)
MCHC: 35 g/dL (ref 30.0–36.0)
MCV: 87.3 fL (ref 80.0–100.0)
Monocytes Absolute: 0.7 10*3/uL (ref 0.1–1.0)
Monocytes Relative: 6 %
Neutro Abs: 9.3 10*3/uL — ABNORMAL HIGH (ref 1.7–7.7)
Neutrophils Relative %: 83 %
Platelets: 273 10*3/uL (ref 150–400)
RBC: 4.71 MIL/uL (ref 3.87–5.11)
RDW: 13.2 % (ref 11.5–15.5)
WBC: 11.1 10*3/uL — ABNORMAL HIGH (ref 4.0–10.5)
nRBC: 0 % (ref 0.0–0.2)

## 2021-09-12 LAB — URINALYSIS, ROUTINE W REFLEX MICROSCOPIC
Bilirubin Urine: NEGATIVE
Glucose, UA: NEGATIVE mg/dL
Ketones, ur: 40 mg/dL — AB
Nitrite: NEGATIVE
Protein, ur: NEGATIVE mg/dL
Specific Gravity, Urine: 1.01 (ref 1.005–1.030)
pH: 6.5 (ref 5.0–8.0)

## 2021-09-12 LAB — SARS CORONAVIRUS 2 (TAT 6-24 HRS): SARS Coronavirus 2: NEGATIVE

## 2021-09-12 MED ORDER — METHOCARBAMOL 500 MG PO TABS: 500.0000 mg | ORAL_TABLET | Freq: Two times a day (BID) | ORAL | 0 refills | Status: DC

## 2021-09-12 MED ORDER — ONDANSETRON HCL 4 MG/2ML IJ SOLN
4.0000 mg | Freq: Once | INTRAMUSCULAR | Status: AC
  Administered 2021-09-12: 4 mg via INTRAVENOUS
  Filled 2021-09-12: qty 2

## 2021-09-12 MED ORDER — LACTATED RINGERS IV BOLUS
1000.0000 mL | Freq: Once | INTRAVENOUS | Status: AC
  Administered 2021-09-12: 1000 mL via INTRAVENOUS

## 2021-09-12 MED ORDER — POTASSIUM CHLORIDE CRYS ER 20 MEQ PO TBCR
40.0000 meq | EXTENDED_RELEASE_TABLET | Freq: Once | ORAL | Status: AC
  Administered 2021-09-12: 40 meq via ORAL
  Filled 2021-09-12: qty 2

## 2021-09-12 MED ORDER — ONDANSETRON 4 MG PO TBDP: 4.0000 mg | ORAL_TABLET | Freq: Three times a day (TID) | ORAL | 0 refills | Status: DC | PRN

## 2021-09-12 NOTE — Discharge Instructions (Addendum)
As we discussed  you appear to be quite dehydrated today  Please drink plenty of water, Pedialyte and primarily eat bland foods including toast, applesauce, bananas.  This will help augment your potassium which was somewhat low today.  Also recommend broth, warm tea with honey and close follow-up with your primary care provider.  You will need to have your potassium levels rechecked.  Should your symptoms worsen or you experience any new or concerning symptoms you may always return to the ER.  Please use Tylenol or ibuprofen for pain.  You may use 200 mg ibuprofen every 6 hours or 1000 mg of Tylenol every 6 hours.  You may choose to alternate between the 2.  This would be most effective.  Not to exceed 4 g of Tylenol within 24 hours.  Not to exceed 3200 mg ibuprofen 24 hours.   Robaxin is a muscle relaxer.

## 2021-09-12 NOTE — ED Triage Notes (Signed)
States last Monday began feeling very tired, Tuesday felt worse, began having a fever, took extra strength tylenol, but very short acting. Virtual with primary care where she had a covid test x 2 and both have been negative, yesterday had a lot of vomiting. Has poor appetite. Still feels worse today.

## 2021-09-12 NOTE — ED Provider Notes (Addendum)
[facility] EMERGENCY DEPARTMENT Provider Note   CSN: [id number] Arrival date & time: 09/12/21  1359     History Chief Complaint  Patient presents with   Fever    Jill Roman is a 55 y.o. female.  HPI Patient is a 55 year old female with a past medical history significant for HTN, thoracic outlet syndrome, ulcerative colitis  Patient presented to the emergency room today with complaints of fatigue, malaise, fever since 6 days ago.  States that she has been taking Tylenol last dose was approximately 2 hours ago.  States that she had some nausea and 2 episodes of nonbloody nonbilious emesis yesterday.  She states she had a COVID and influenza PCR test that were negative.   She states that her temperature got as high as 102.7 at home.  She states that she has been able to control the fever at home with Tylenol.  She states that she had diarrhea x2 today she describes it as loose but not watery.  No recent antibiotics.  Does have history of ulcerative colitis but states that she has no abdominal pain does not feel that she is having a flare has not had any melena hematochezia or mucus in stool.  Denies any chest pain or shortness of breath states that she does not have a productive cough but occasional dry cough.  No significant congestion.  Denies any urinary frequency urgency dysuria or hematuria.  No vaginal discharge or irritation.  Denies any pain currently other than just diffuse body aches  No other associate symptoms.  No aggravating or mitigating factors.    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[place])    followed by dr Collene Mares;  per pt dx 2003   Diverticular disease    Fever    09-10-2021  per pt started body aches/ fever/ headache 09-07-2021 night fever 102 and stated today fever 101 at highest but  denies sob, congestion, gi symptoms;  stated had televisit with her pcp today and had  negative covid/ flu test , told some type of virus and take tylenol   History of deep venous thrombosis 2010   left upper extremity,  completed blood thinner treatment  (09-10-2021 pt stated no clot before and none since 2010)   Hypertension    Thickened endometrium    Thoracic outlet syndrome 2010   left sided venous thrombosis;  s/p left first rib resection 01-23-2009 @[facility]    Wears glasses     Patient Active Problem List   Diagnosis Date Noted   Primary hypertension 09/04/2021   Ovarian malignant neoplasm, left ([place]) 03/29/2021   Abnormal ultrasound of ovary 02/14/2021   Irregular bleeding 02/14/2021   Endometrial polyp 02/14/2021    Past Surgical History:  Procedure Laterality Date   COLONOSCOPY  08/26/2021   last one per pt   RIB RESECTION Left 12/30/2008   @[facility] ;  transaxillary first left rib due to thoracic outlet syndrome     OB History     Gravida  0   Para  0   Term  0   Preterm  0   AB  0   Living  0      SAB  0   IAB  0   Ectopic  0   Multiple  0   Live Births              Family History  Problem Relation Age of Onset   Heart disease Mother    Anxiety disorder Mother    Depression Mother        severe depression   Heart disease Father        chf   Hypertension Father    Heart attack Father    Breast cancer Maternal Grandmother        age 58's   Heart attack Maternal Grandmother     Social History   Tobacco Use   Smoking status: Some Days    Years: 3.00    Types: Cigarettes   Smokeless tobacco: Never   Tobacco comments:    09-10-2021  per pt smokes seldom , socially, 1 or 2  Vaping Use   Vaping Use: Never used  Substance Use Topics   Alcohol use: Not Currently    Comment: occasional   Drug use: Never    Home Medications Prior to Admission medications   Medication Sig Start Date End Date Taking? Authorizing Provider  methocarbamol (ROBAXIN) 500 MG tablet Take 1 tablet (500 mg total) by mouth 2 (two) times daily.  09/12/21  Yes Dmoni Fortson S, PA  ondansetron (ZOFRAN-ODT) 4 MG disintegrating tablet Take 1 tablet (4 mg total) by mouth every 8 (eight) hours as needed for nausea or vomiting. 09/12/21  Yes Ria Redcay, Ova Freshwater S, PA  acetaminophen (TYLENOL) 500 MG tablet Take 1,000 mg by mouth every 6 (six) hours as needed.    [provider]  Ascorbic Acid (VITAMIN C PO) Take by mouth daily.    [provider]  azelastine (ASTELIN) 0.1 % nasal spray Place into both nostrils 2 (two) times daily as needed for rhinitis. Use in each nostril as directed    [provider]  B Complex Vitamins (B COMPLEX PO) Take by mouth daily.    [provider]  cetirizine (ZYRTEC) 10 MG tablet Take 10 mg by mouth as needed.     [provider]  Cholecalciferol (VITAMIN D PO) Take 1,000 Int'l Units by mouth daily.     [provider]  mesalamine (CANASA) 1000 MG suppository Place 1,000 mg rectally daily.    [provider]  mesalamine (LIALDA) 1.2 g EC tablet 1.2 g daily. 09/03/18   [provider]  metoprolol succinate (TOPROL-XL) 25 MG 24 hr tablet Take 25 mg by mouth daily. Patient not taking: Reported on 08/28/2021    [provider]  perindopril (ACEON) 8 MG tablet Take 4 mg by mouth 2 (two) times daily. 11/03/20   [provider]  Probiotic Product (PROBIOTIC PO) Take by mouth. Ultra flora IB Patient not taking: Reported on 02/12/2021    [provider]    Allergies    Patient has no known allergies.  Review of Systems   Review of Systems  Constitutional:  Positive for fatigue and fever. Negative for chills.  HENT:  Negative for congestion.   Eyes:  Negative for pain.  Respiratory:  Positive for cough. Negative for shortness of breath.   Cardiovascular:  Negative for chest pain and leg swelling.  Gastrointestinal:  Positive for diarrhea, nausea and vomiting. Negative for abdominal pain.  Genitourinary:  Negative for dysuria.   Musculoskeletal:  Positive for myalgias.  Skin:  Negative for rash.  Neurological:  Negative for dizziness and headaches.   Physical Exam Updated Vital Signs BP (!) 182/96   Pulse 97   Temp 98.4 F (36.9 C) (Oral)   Resp 18   Ht 5' 6"  (1.676  m)   Wt 75.6 kg   LMP 08/27/2021 (Approximate)   SpO2 97%   BMI 26.90 kg/m   Physical Exam Vitals and nursing note reviewed.  Constitutional:      General: She is not in acute distress.    Comments: Pleasant well-appearing 54 year old.  In no acute distress.  Sitting comfortably in bed.  Able answer questions appropriately follow commands. No increased work of breathing. Speaking in full sentences.   HENT:     Head: Normocephalic and atraumatic.     Nose: Nose normal.     Mouth/Throat:     Mouth: Mucous membranes are dry.  Eyes:     General: No scleral icterus. Cardiovascular:     Rate and Rhythm: Normal rate and regular rhythm.     Pulses: Normal pulses.     Heart sounds: Normal heart sounds.  Pulmonary:     Effort: Pulmonary effort is normal. No respiratory distress.     Breath sounds: No wheezing.     Comments: Lungs clear TAB Abdominal:     Palpations: Abdomen is soft.     Tenderness: There is no abdominal tenderness. There is no guarding or rebound.     Comments: Abdomen is soft nontender  Musculoskeletal:     Cervical back: Normal range of motion.     Right lower leg: No edema.     Left lower leg: No edema.  Skin:    General: Skin is warm and dry.     Capillary Refill: Capillary refill takes less than 2 seconds.  Neurological:     Mental Status: She is alert. Mental status is at baseline.  Psychiatric:        Mood and Affect: Mood normal.        Behavior: Behavior normal.    ED Results / Procedures / Treatments   Labs (all labs ordered are listed, but only abnormal results are displayed) Labs Reviewed  CBC WITH DIFFERENTIAL/PLATELET - Abnormal; Notable for the following components:      Result Value   WBC 11.1  (*)    Neutro Abs 9.3 (*)    All other components within normal limits  COMPREHENSIVE METABOLIC PANEL - Abnormal; Notable for the following components:   Sodium 129 (*)    Potassium 3.3 (*)    Chloride 96 (*)    CO2 21 (*)    Glucose, Bld 173 (*)    All other components within normal limits  URINALYSIS, ROUTINE W REFLEX MICROSCOPIC - Abnormal; Notable for the following components:   Hgb urine dipstick TRACE (*)    Ketones, ur 40 (*)    Leukocytes,Ua SMALL (*)    All other components within normal limits  URINALYSIS, MICROSCOPIC (REFLEX) - Abnormal; Notable for the following components:   Bacteria, UA MANY (*)    All other components within normal limits  RESP PANEL BY RT-PCR (FLU A&B, COVID) [id number]  URINE CULTURE    EKG None  Radiology DG Chest Portable 1 View  Result Date: 09/12/2021 CLINICAL DATA:  Fever EXAM: PORTABLE CHEST 1 VIEW COMPARISON:  08/29/2008 FINDINGS: The heart size and mediastinal contours are within normal limits. Both lungs are clear. The visualized skeletal structures are unremarkable. IMPRESSION: No acute abnormality of the lungs in AP portable projection. Electronically Signed   By: Delanna Ahmadi M.D.   On: 09/12/2021 15:03    Procedures Procedures   Medications Ordered in ED Medications  lactated ringers bolus 1,000 mL (0 mLs Intravenous Stopped 09/12/21 1553)  ondansetron (  ZOFRAN) injection 4 mg (4 mg Intravenous Given 09/12/21 1452)  potassium chloride SA (KLOR-CON M) CR tablet 40 mEq (40 mEq Oral Given 09/12/21 1702)    ED Course  I have reviewed the triage vital signs and the nursing notes.  Pertinent labs & imaging results that were available during my care of the patient were reviewed by me and considered in my medical decision making (see chart for details).  Clinical Course as of 09/12/21 1719  Sat Sep 12, 2021  1431 Monday night fatigue tired myalgias Tuesday - temp 102.7 at home.  Vomited yesterday once NBNB. Diarrhea x2 today. Hx of UC.   [WF]  1648 Took tylenol around 12pm last.  [WF]    Clinical Course User Index [WF] Tedd Sias, [place]   MDM Rules/Calculators/A&P                          Patient is presented to the emergency room today with complaints of fatigue malaise fevers chills and myalgias.  Of little exam patient is well-appearing with a temperature of 99.5 took Tylenol at approximately noon.  She states that she is having trouble keeping her fevers down for the 6 hours in between her Tylenol doses.  Seems that her symptoms are mild to moderate when her fevers under control however she is having difficulty with this.  She has not taken any ibuprofen due to her ulcerative colitis.  On physical exam lungs are clear abdomen is soft nontender.  Patient is not ill-appearing but somewhat fatigued appearing.  Reviewed up-to-date recommendations 200 mg ibuprofen between her Tylenol doses is unlikely to put her at any increased risk for flares.  Her tachycardia improved here with fluids.  She is tolerating p.o. and states that she is now hungry after nausea medicine and fluids.  Repeat abdominal exam is benign.  Patient reiterates that she has no urinary frequency urgency dysuria or hematuria.  States that her urine was somewhat dark but she attributed this to being dehydrated.  CBC with mild leukocytosis of 11.1.  No anemia.  CMP with mild hypokalemia mild hyponatremia and mild hypochloremia given 1 L of LR with improvement in his fatigue and she is tolerating p.o. has more moist oral mucosa now.  Tachycardia resolved.  Urinalysis with many bacteria however urinary WBCs less than 10, nitrate negative small leukocytes and no urinary symptoms.  We did shared decision-making conversation will obtain urine culture and hold off on any empiric treatment as she has no UTI symptoms Chest x-ray personally reviewed agree of radiology read unremarkable.  Suspect viral illness given her symptoms.  Very strict return precautions  for any confusion, nausea, vomiting, fevers, worsening loose stool.  She has not had any infectious sounding diarrhea no blood in her stool and no watery diarrhea no recent antibiotics doubt C. difficile.  Patient is understanding of need for strict fever control with Tylenol and understanding of it being reasonable to use 200 mg of ibuprofen in between Tylenol doses--I provided patient with this recommendation based off up-to-date review of NSAID use with ulcerative colitis.  Patient has no history of cirrhosis, heart failure, CKD or other renal disease.  Kidney function is normal today and patient appears hypovolemic.  Her hyponatremia is relatively mild and her symptoms significantly improved with fever control which makes hyponatremia as a driving cause of her symptoms today very unlikely. Given strict return precautions for worsening weakness/fatigue. Will need to have labs checked Monday.  She does not feel that she is having a current flare of ulcerative colitis she is still without tenderness to palpation on my exam.  Will recommend continued Tylenol use with small dose 20 mg ibuprofen and Pedialyte, water, food to help with her electrolyte abnormalities.  She will need to have electrolytes rechecked on Monday by PCP.  Return precautions given.  Final Clinical Impression(s) / ED Diagnoses Final diagnoses:  Febrile illness  Hyponatremia  Hypokalemia    Rx / DC Orders ED Discharge Orders          Ordered    methocarbamol (ROBAXIN) 500 MG tablet  2 times daily        09/12/21 1700    ondansetron (ZOFRAN-ODT) 4 MG disintegrating tablet  Every 8 hours PRN        09/12/21 1700             Tedd Sias, [place] 09/12/21 1719    Tedd Sias, PA 09/12/21 [place], [place], DO 09/13/21 [phone]

## 2021-09-14 ENCOUNTER — Encounter (HOSPITAL_BASED_OUTPATIENT_CLINIC_OR_DEPARTMENT_OTHER): Payer: Self-pay | Admitting: Obstetrics & Gynecology

## 2021-09-14 ENCOUNTER — Other Ambulatory Visit (HOSPITAL_BASED_OUTPATIENT_CLINIC_OR_DEPARTMENT_OTHER): Payer: Self-pay | Admitting: Obstetrics & Gynecology

## 2021-09-14 LAB — URINE CULTURE

## 2021-09-15 DIAGNOSIS — I10 Essential (primary) hypertension: Secondary | ICD-10-CM

## 2021-09-23 ENCOUNTER — Encounter (HOSPITAL_BASED_OUTPATIENT_CLINIC_OR_DEPARTMENT_OTHER): Payer: Self-pay

## 2021-10-02 ENCOUNTER — Other Ambulatory Visit (HOSPITAL_BASED_OUTPATIENT_CLINIC_OR_DEPARTMENT_OTHER): Payer: Self-pay | Admitting: Obstetrics & Gynecology

## 2021-10-06 ENCOUNTER — Other Ambulatory Visit: Payer: BC Managed Care – PPO

## 2021-12-01 ENCOUNTER — Encounter (HOSPITAL_BASED_OUTPATIENT_CLINIC_OR_DEPARTMENT_OTHER): Payer: BC Managed Care – PPO | Admitting: Obstetrics & Gynecology

## 2021-12-03 ENCOUNTER — Ambulatory Visit (INDEPENDENT_AMBULATORY_CARE_PROVIDER_SITE_OTHER): Payer: BC Managed Care – PPO | Admitting: Obstetrics & Gynecology

## 2021-12-03 ENCOUNTER — Other Ambulatory Visit: Payer: Self-pay

## 2021-12-03 ENCOUNTER — Encounter (HOSPITAL_BASED_OUTPATIENT_CLINIC_OR_DEPARTMENT_OTHER): Payer: Self-pay | Admitting: Obstetrics & Gynecology

## 2021-12-03 VITALS — BP 151/84 | HR 70 | Ht 66.5 in | Wt 164.6 lb

## 2021-12-03 DIAGNOSIS — N840 Polyp of corpus uteri: Secondary | ICD-10-CM | POA: Diagnosis not present

## 2021-12-03 DIAGNOSIS — R9389 Abnormal findings on diagnostic imaging of other specified body structures: Secondary | ICD-10-CM | POA: Diagnosis not present

## 2021-12-03 DIAGNOSIS — I10 Essential (primary) hypertension: Secondary | ICD-10-CM

## 2021-12-03 DIAGNOSIS — C562 Malignant neoplasm of left ovary: Secondary | ICD-10-CM

## 2021-12-04 NOTE — Progress Notes (Signed)
56 y.o. G0P0000 Single White or Caucasian female here for discussion of upcoming procedure.  Pt has not been seen in over three months.  Procedure was scheduled in the winter but cancelled due to pt having febrile/viral illness.  Fever at one point was almost 103.  She was seen in the ER on 09/12/2021.  Flu and Covid testing were negative.  She did have a mild elevated WBC ct.  All of her symptoms have resolved.  Notes from ER and labs all reviewed.  History reviewed and updated.   Due to probable endometrial polyp and complex left ovarian cyst/mass, hysteroscopy with probable polyp resection, laparoscopy with left cystectomy and left salpingectomy vs LSO, and right salpingectomy is planned.  Reviewed this with pt today.  She would very much like to keep one ovary if possible.    Procedure discussed with patient.  Hospital stay, recovery and pain management all discussed.  Risks discussed including but not limited to bleeding, <1% risk of receiving a  transfusion, infection, risk of bowel/bladder/ureteral/vascular injury discussed as well as possible need for additional surgery if injury does occur discussed.  DVT/PE and rare risk of death discussed.  My actual complications with prior surgeries discussed.  Positioning and incision locations discussed.  Patient aware if pathology abnormal she may need additional treatment.  All questions answered.     Ob Hx:   Pt is PMP Last pap: 01/16/2020, neg with neg HR HPV Last MMG: 04/2021 Smoking: No   Past Surgical History:  Procedure Laterality Date   COLONOSCOPY  08/26/2021   last one per pt   RIB RESECTION Left 12/30/2008   @[facility] ;  transaxillary first left rib due to thoracic outlet syndrome    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[place])    followed by dr Collene Mares;  per pt dx 2003   Diverticular disease    Fever    09-10-2021  per pt started body aches/ fever/ headache  09-07-2021 night fever 102 and stated today fever 101 at highest but  denies sob, congestion, gi symptoms;  stated had televisit with her pcp today and had negative covid/ flu test , told some type of virus and take tylenol   History of deep venous thrombosis 2010   left upper extremity,  completed blood thinner treatment  (09-10-2021 pt stated no clot before and none since 2010)   Hypertension    Thickened endometrium    Thoracic outlet syndrome 2010   left sided venous thrombosis;  s/p left first rib resection 01-23-2009 @[facility]    Wears glasses     Allergies: Patient has no known allergies.  Current Outpatient Medications  Medication Sig Dispense Refill   acetaminophen (TYLENOL) 500 MG tablet Take 1,000 mg by mouth every 6 (six) hours as needed.     Ascorbic Acid (VITAMIN C PO) Take by mouth daily.     azelastine (ASTELIN) 0.1 % nasal spray Place into both nostrils 2 (two) times daily as needed for rhinitis. Use in each nostril as directed     B Complex Vitamins (B COMPLEX PO) Take by mouth daily.     cetirizine (ZYRTEC) 10 MG tablet Take 10 mg by mouth as needed.      Cholecalciferol (VITAMIN D PO) Take 1,000 Int'l Units by mouth daily.      mesalamine (CANASA) 1000 MG suppository Place 1,000 mg rectally daily.     mesalamine (  LIALDA) 1.2 g EC tablet 1.2 g daily.     methocarbamol (ROBAXIN) 500 MG tablet Take 1 tablet (500 mg total) by mouth 2 (two) times daily. 20 tablet 0   perindopril (ACEON) 8 MG tablet Take 4 mg by mouth 2 (two) times daily.     Probiotic Product (PROBIOTIC PO) Take by mouth. Ultra flora IB (Patient not taking: Reported on 02/12/2021)     No current facility-administered medications for this visit.    ROS: A comprehensive review of systems was negative.  Exam:   BP (!) 151/84 (BP Location: Right Arm, Patient Position: Sitting, Cuff Size: Large)    Pulse 70    Ht 5' 6.5" (1.689 m) Comment: reported   Wt 164 lb 9.6 oz (74.7 kg)    BMI 26.17 kg/m   General  appearance: alert and cooperative Head: Normocephalic, without obvious abnormality, atraumatic Neck: no adenopathy, supple, symmetrical, trachea midline and thyroid not enlarged, symmetric, no tenderness/mass/nodules Lungs: clear to auscultation bilaterally Heart: regular rate and rhythm, S1, S2 normal, no murmur, click, rub or gallop Abdomen: soft, non-tender; bowel sounds normal; no masses,  no organomegaly Extremities: extremities normal, atraumatic, no cyanosis or edema Skin: Skin color, texture, turgor normal. No rashes or lesions Lymph nodes: Cervical, supraclavicular, and axillary nodes normal. no inguinal nodes palpated Neurologic: Grossly normal  Assessment/Plan: 1. Ovarian malignant neoplasm, left (HCC) - planning Left ovarian cystectomy with left salpingectomy or LSO if needed - will also proceed with right salpingectomy - ca 125 earlier this year was normal - Pre and post op instructions reviewed - Post op pain medications reviewed  2. Thickened endometrium - hysteroscopy with polyp resection, D&C planned  3. Endometrial polyp  4. Primary hypertension

## 2021-12-07 ENCOUNTER — Encounter (HOSPITAL_COMMUNITY): Payer: Self-pay | Admitting: Obstetrics & Gynecology

## 2021-12-07 ENCOUNTER — Other Ambulatory Visit: Payer: Self-pay

## 2021-12-07 MED ORDER — SODIUM CHLORIDE 0.9 % IV SOLN
2.0000 g | INTRAVENOUS | Status: AC
  Administered 2021-12-08: 2 g via INTRAVENOUS
  Filled 2021-12-07 (×2): qty 2

## 2021-12-07 NOTE — Pre-Procedure Instructions (Signed)
[id number]: [phone]: [phone]  PCP - SArdith Dark PA Cardiologist - Denies   Chest x-ray - 09/12/21 EKG - DOS   ERAS Protcol - Clears until 0430  COVID TEST- N/A  Anesthesia review: N  Patient verbally denies any shortness of breath, fever, cough and chest pain during phone call   -------------  SDW INSTRUCTIONS given:  Your procedure is scheduled on 12/08/21.  [facility]30 [facility].M., and check in at the Admitting office.  Call this number if you have problems the morning of surgery:  [phone]   Remember:  Do not eat after midnight the night before your surgery  You may drink clear liquids until 0430 the morning of your surgery.   Clear liquids allowed are: Water, Non-Citrus Juices (without pulp), Carbonated Beverages, Clear Tea, Black Coffee ONLY (NO MILK, CREAM OR POWDERED CREAMER of any kind), and Gatorade      Take these medicines the morning of surgery with A SIP OF WATER  cetirizine (ZYRTEC)  acetaminophen (TYLENOL) - if needed  As of today, STOP taking any Aspirin (unless otherwise instructed by your surgeon) Aleve, Naproxen, Ibuprofen, Motrin, Advil, Goody's, BC's, all herbal medications, fish oil, and all vitamins.                      Do not wear jewelry, make up, or nail polish            Do not wear lotions, powders, perfumes/colognes, or deodorant.            Do not shave 48 hours prior to surgery.  Men may shave face and neck.            Do not bring valuables to the hospital.            [facility] any belongings or valuables.  Do NOT Smoke (Tobacco/Vaping) 24 hours prior to your procedure If you use a CPAP at night, you may bring all equipment for your overnight stay.   Contacts, glasses, dentures or bridgework may not be worn into surgery.      For patients admitted to the  hospital, discharge time will be determined by your treatment team.   Patients discharged the day of surgery will not be allowed to drive home, and someone needs to stay with them for 24 hours.    Special instructions:   [place]- Preparing For Surgery  Before surgery, you can play an important role. Because skin is not sterile, your skin needs to be as free of germs as possible. You can reduce the number of germs on your skin by washing with CHG (chlorahexidine gluconate) Soap before surgery.  CHG is an antiseptic cleaner which kills germs and bonds with the skin to continue killing germs even after washing.    Oral Hygiene is also important to reduce your risk of infection.  Remember - BRUSH YOUR TEETH THE MORNING OF SURGERY WITH YOUR REGULAR TOOTHPASTE  Please do not use if you have an allergy to CHG or antibacterial soaps. If your skin becomes reddened/irritated stop using the CHG.  Do not shave (including legs and underarms) for at least 48 hours prior to first CHG shower. It is OK to shave your face.  Please follow these instructions carefully.   Shower the NIGHT BEFORE SURGERY and the MORNING OF  SURGERY with DIAL Soap.   Pat yourself dry with a CLEAN TOWEL.  Wear CLEAN PAJAMAS to bed the night before surgery  Place CLEAN SHEETS on your bed the night of your first shower and DO NOT SLEEP WITH PETS.   Day of Surgery: Please shower morning of surgery  Wear Clean/Comfortable clothing the morning of surgery Do not apply any deodorants/lotions.   Remember to brush your teeth WITH YOUR REGULAR TOOTHPASTE.   Questions were answered. Patient verbalized understanding of instructions.

## 2021-12-07 NOTE — Anesthesia Preprocedure Evaluation (Addendum)
Anesthesia Evaluation  Patient identified by MRN, date of birth, ID band Patient awake    Reviewed: Allergy & Precautions, H&P , NPO status , Patient's Chart, lab work & pertinent test results  History of Anesthesia Complications (+) history of anesthetic complications  Airway Mallampati: I  TM Distance: >3 FB Neck ROM: Full    Dental no notable dental hx. (+) Teeth Intact, Dental Advisory Given, Caps   Pulmonary neg pulmonary ROS, Current Smoker,    Pulmonary exam normal breath sounds clear to auscultation       Cardiovascular Exercise Tolerance: Good hypertension, Pt. on medications and Pt. on home beta blockers negative cardio ROS Normal cardiovascular exam Rhythm:Regular Rate:Normal     Neuro/Psych negative neurological ROS  negative psych ROS   GI/Hepatic negative GI ROS, Neg liver ROS, PUD,   Endo/Other  negative endocrine ROS  Renal/GU negative Renal ROS  negative genitourinary   Musculoskeletal negative musculoskeletal ROS (+)   Abdominal   Peds negative pediatric ROS (+)  Hematology negative hematology ROS (+)   Anesthesia Other Findings   Reproductive/Obstetrics negative OB ROS                            Anesthesia Physical Anesthesia Plan  ASA: 2  Anesthesia Plan: General   Post-op Pain Management: Tylenol PO (pre-op)*, Celebrex PO (pre-op)* and Gabapentin PO (pre-op)*   Induction: Intravenous  PONV Risk Score and Plan: 4 or greater and Ondansetron, Dexamethasone and Treatment may vary due to age or medical condition  Airway Management Planned: Oral ETT  Additional Equipment: None  Intra-op Plan:   Post-operative Plan: Extubation in OR  Informed Consent: I have reviewed the patients History and Physical, chart, labs and discussed the procedure including the risks, benefits and alternatives for the proposed anesthesia with the patient or authorized representative  who has indicated his/her understanding and acceptance.       Plan Discussed with: Anesthesiologist and CRNA  Anesthesia Plan Comments: (  )       Anesthesia Quick Evaluation

## 2021-12-08 ENCOUNTER — Encounter (HOSPITAL_COMMUNITY)
Admission: RE | Disposition: A | Discharge status: Home / Self Care | Payer: Self-pay | Source: Home / Self Care | Attending: Obstetrics & Gynecology

## 2021-12-08 ENCOUNTER — Ambulatory Visit (HOSPITAL_COMMUNITY): Payer: BC Managed Care – PPO | Admitting: Physician Assistant

## 2021-12-08 ENCOUNTER — Ambulatory Visit (HOSPITAL_COMMUNITY)
Admission: RE | Admit: 2021-12-08 | Discharge: 2021-12-08 | Disposition: A | Discharge status: Home / Self Care | Payer: BC Managed Care – PPO | Attending: Obstetrics & Gynecology | Admitting: Obstetrics & Gynecology

## 2021-12-08 ENCOUNTER — Other Ambulatory Visit: Payer: Self-pay

## 2021-12-08 ENCOUNTER — Encounter (HOSPITAL_COMMUNITY): Payer: Self-pay | Admitting: Obstetrics & Gynecology

## 2021-12-08 DIAGNOSIS — N938 Other specified abnormal uterine and vaginal bleeding: Secondary | ICD-10-CM | POA: Diagnosis not present

## 2021-12-08 DIAGNOSIS — I10 Essential (primary) hypertension: Secondary | ICD-10-CM | POA: Diagnosis not present

## 2021-12-08 DIAGNOSIS — Z01818 Encounter for other preprocedural examination: Secondary | ICD-10-CM

## 2021-12-08 DIAGNOSIS — D271 Benign neoplasm of left ovary: Secondary | ICD-10-CM | POA: Diagnosis not present

## 2021-12-08 DIAGNOSIS — R935 Abnormal findings on diagnostic imaging of other abdominal regions, including retroperitoneum: Secondary | ICD-10-CM

## 2021-12-08 DIAGNOSIS — Z79899 Other long term (current) drug therapy: Secondary | ICD-10-CM | POA: Diagnosis not present

## 2021-12-08 DIAGNOSIS — N838 Other noninflammatory disorders of ovary, fallopian tube and broad ligament: Secondary | ICD-10-CM | POA: Diagnosis not present

## 2021-12-08 DIAGNOSIS — Z8711 Personal history of peptic ulcer disease: Secondary | ICD-10-CM | POA: Insufficient documentation

## 2021-12-08 DIAGNOSIS — F17210 Nicotine dependence, cigarettes, uncomplicated: Secondary | ICD-10-CM | POA: Diagnosis not present

## 2021-12-08 DIAGNOSIS — R9389 Abnormal findings on diagnostic imaging of other specified body structures: Secondary | ICD-10-CM | POA: Diagnosis not present

## 2021-12-08 DIAGNOSIS — N83202 Unspecified ovarian cyst, left side: Secondary | ICD-10-CM | POA: Diagnosis present

## 2021-12-08 DIAGNOSIS — R1900 Intra-abdominal and pelvic swelling, mass and lump, unspecified site: Secondary | ICD-10-CM | POA: Diagnosis not present

## 2021-12-08 DIAGNOSIS — D279 Benign neoplasm of unspecified ovary: Secondary | ICD-10-CM

## 2021-12-08 HISTORY — DX: Other complications of anesthesia, initial encounter: T88.59XA

## 2021-12-08 HISTORY — DX: Abnormal findings on diagnostic imaging of other specified body structures: R93.89

## 2021-12-08 HISTORY — DX: Fever, unspecified: R50.9

## 2021-12-08 HISTORY — PX: HYSTEROSCOPY WITH D & C: SHX1775

## 2021-12-08 HISTORY — DX: Essential (primary) hypertension: I10

## 2021-12-08 HISTORY — DX: Ulcerative (chronic) pancolitis without complications: K51.00

## 2021-12-08 HISTORY — DX: Chronic rhinitis: J31.0

## 2021-12-08 HISTORY — DX: Diverticulosis of intestine, part unspecified, without perforation or abscess without bleeding: K57.90

## 2021-12-08 HISTORY — DX: Presence of spectacles and contact lenses: Z97.3

## 2021-12-08 HISTORY — DX: Other specified postprocedural states: Z98.890

## 2021-12-08 HISTORY — DX: Other specified conditions associated with female genital organs and menstrual cycle: N94.89

## 2021-12-08 LAB — BASIC METABOLIC PANEL
Anion gap: 10 (ref 5–15)
BUN: 10 mg/dL (ref 6–20)
CO2: 25 mmol/L (ref 22–32)
Calcium: 9.4 mg/dL (ref 8.9–10.3)
Chloride: 101 mmol/L (ref 98–111)
Creatinine, Ser: 0.78 mg/dL (ref 0.44–1.00)
GFR, Estimated: >60 mL/min (ref >60)
Glucose, Bld: 97 mg/dL (ref 70–99)
Potassium: 3.9 mmol/L (ref 3.5–5.1)
Sodium: 136 mmol/L (ref 135–145)

## 2021-12-08 LAB — TYPE AND SCREEN
ABO/RH(D): A POS
Antibody Screen: NEGATIVE

## 2021-12-08 LAB — CBC
HCT: 42.4 % (ref 36.0–46.0)
Hemoglobin: 14.1 g/dL (ref 12.0–15.0)
MCH: 29.7 pg (ref 26.0–34.0)
MCHC: 33.3 g/dL (ref 30.0–36.0)
MCV: 89.5 fL (ref 80.0–100.0)
Platelets: 299 10*3/uL (ref 150–400)
RBC: 4.74 MIL/uL (ref 3.87–5.11)
RDW: 13.5 % (ref 11.5–15.5)
WBC: 7.4 10*3/uL (ref 4.0–10.5)
nRBC: 0 % (ref 0.0–0.2)

## 2021-12-08 LAB — POCT PREGNANCY, URINE: Preg Test, Ur: NEGATIVE

## 2021-12-08 LAB — ABO/RH: ABO/RH(D): A POS

## 2021-12-08 SURGERY — OOPHORECTOMY, LAPAROSCOPIC
Anesthesia: General | Site: Uterus

## 2021-12-08 MED ORDER — ACETAMINOPHEN 160 MG/5ML PO SOLN: 325.0000 mg | ORAL | Status: DC | PRN

## 2021-12-08 MED ORDER — DEXAMETHASONE SODIUM PHOSPHATE 10 MG/ML IJ SOLN
INTRAMUSCULAR | Status: DC | PRN
  Administered 2021-12-08: 10 mg via INTRAVENOUS

## 2021-12-08 MED ORDER — IBUPROFEN 800 MG PO TABS
800.0000 mg | ORAL_TABLET | Freq: Three times a day (TID) | ORAL | 0 refills | Status: DC | PRN
Start: 2021-12-08 — End: 2022-01-08

## 2021-12-08 MED ORDER — LIDOCAINE-EPINEPHRINE 1 %-1:100000 IJ SOLN
INTRAMUSCULAR | Status: AC
  Filled 2021-12-08: qty 1

## 2021-12-08 MED ORDER — MEPERIDINE HCL 25 MG/ML IJ SOLN: 6.2500 mg | INTRAMUSCULAR | Status: DC | PRN

## 2021-12-08 MED ORDER — ACETAMINOPHEN 325 MG PO TABS: 325.0000 mg | ORAL_TABLET | ORAL | Status: DC | PRN

## 2021-12-08 MED ORDER — PROPOFOL 10 MG/ML IV BOLUS
INTRAVENOUS | Status: AC
  Filled 2021-12-08: qty 20

## 2021-12-08 MED ORDER — LIDOCAINE 2% (20 MG/ML) 5 ML SYRINGE
INTRAMUSCULAR | Status: AC
  Filled 2021-12-08: qty 5

## 2021-12-08 MED ORDER — LIDOCAINE-EPINEPHRINE 1 %-1:100000 IJ SOLN
INTRAMUSCULAR | Status: DC | PRN
  Administered 2021-12-08: 10 mL

## 2021-12-08 MED ORDER — KETOROLAC TROMETHAMINE 30 MG/ML IJ SOLN
INTRAMUSCULAR | Status: AC
  Filled 2021-12-08: qty 1

## 2021-12-08 MED ORDER — PHENYLEPHRINE 40 MCG/ML (10ML) SYRINGE FOR IV PUSH (FOR BLOOD PRESSURE SUPPORT)
PREFILLED_SYRINGE | INTRAVENOUS | Status: DC | PRN
  Administered 2021-12-08: 40 ug via INTRAVENOUS

## 2021-12-08 MED ORDER — LACTATED RINGERS IV SOLN: INTRAVENOUS | Status: DC

## 2021-12-08 MED ORDER — GABAPENTIN 300 MG PO CAPS
300.0000 mg | ORAL_CAPSULE | ORAL | Status: AC
  Administered 2021-12-08: 300 mg via ORAL
  Filled 2021-12-08: qty 1

## 2021-12-08 MED ORDER — HYDROCODONE-ACETAMINOPHEN 5-325 MG PO TABS
1.0000 | ORAL_TABLET | Freq: Four times a day (QID) | ORAL | 0 refills | Status: DC | PRN
Start: 2021-12-08 — End: 2022-01-08

## 2021-12-08 MED ORDER — SODIUM CHLORIDE 0.9 % IR SOLN
Status: DC | PRN
  Administered 2021-12-08: 1000 mL

## 2021-12-08 MED ORDER — HYDROCODONE-ACETAMINOPHEN 5-325 MG PO TABS: 1.0000 | ORAL_TABLET | Freq: Four times a day (QID) | ORAL | 0 refills | Status: DC | PRN

## 2021-12-08 MED ORDER — IBUPROFEN 800 MG PO TABS: 800.0000 mg | ORAL_TABLET | Freq: Three times a day (TID) | ORAL | 0 refills | Status: DC | PRN

## 2021-12-08 MED ORDER — SILVER NITRATE-POT NITRATE 75-25 % EX MISC
CUTANEOUS | Status: AC
  Filled 2021-12-08: qty 10

## 2021-12-08 MED ORDER — CHLORHEXIDINE GLUCONATE 0.12 % MT SOLN
OROMUCOSAL | Status: AC
  Administered 2021-12-08: 15 mL
  Filled 2021-12-08: qty 15

## 2021-12-08 MED ORDER — ACETAMINOPHEN 500 MG PO TABS
1000.0000 mg | ORAL_TABLET | Freq: Once | ORAL | Status: AC
  Administered 2021-12-08: 1000 mg via ORAL
  Filled 2021-12-08: qty 2

## 2021-12-08 MED ORDER — ONDANSETRON HCL 4 MG/2ML IJ SOLN: 4.0000 mg | Freq: Once | INTRAMUSCULAR | Status: DC | PRN

## 2021-12-08 MED ORDER — ONDANSETRON HCL 4 MG/2ML IJ SOLN
INTRAMUSCULAR | Status: DC | PRN
  Administered 2021-12-08: 4 mg via INTRAVENOUS

## 2021-12-08 MED ORDER — SUGAMMADEX SODIUM 200 MG/2ML IV SOLN
INTRAVENOUS | Status: DC | PRN
Start: 2021-12-08 — End: 2021-12-08
  Administered 2021-12-08: 200 mg via INTRAVENOUS

## 2021-12-08 MED ORDER — MIDAZOLAM HCL 2 MG/2ML IJ SOLN
INTRAMUSCULAR | Status: AC
  Filled 2021-12-08: qty 2

## 2021-12-08 MED ORDER — FENTANYL CITRATE (PF) 250 MCG/5ML IJ SOLN
INTRAMUSCULAR | Status: AC
  Filled 2021-12-08: qty 5

## 2021-12-08 MED ORDER — OXYCODONE HCL 5 MG PO TABS: 5.0000 mg | ORAL_TABLET | Freq: Once | ORAL | Status: DC | PRN

## 2021-12-08 MED ORDER — BUPIVACAINE HCL (PF) 0.25 % IJ SOLN
INTRAMUSCULAR | Status: AC
  Filled 2021-12-08: qty 30

## 2021-12-08 MED ORDER — MIDAZOLAM HCL 5 MG/5ML IJ SOLN
INTRAMUSCULAR | Status: DC | PRN
Start: 2021-12-08 — End: 2021-12-08
  Administered 2021-12-08: 2 mg via INTRAVENOUS

## 2021-12-08 MED ORDER — PROPOFOL 500 MG/50ML IV EMUL
INTRAVENOUS | Status: DC | PRN
  Administered 2021-12-08: 25 ug/kg/min via INTRAVENOUS

## 2021-12-08 MED ORDER — OXYCODONE HCL 5 MG/5ML PO SOLN: 5.0000 mg | Freq: Once | ORAL | Status: DC | PRN

## 2021-12-08 MED ORDER — ROCURONIUM BROMIDE 10 MG/ML (PF) SYRINGE
PREFILLED_SYRINGE | INTRAVENOUS | Status: DC | PRN
  Administered 2021-12-08 (×2): 20 mg via INTRAVENOUS
  Administered 2021-12-08: 60 mg via INTRAVENOUS

## 2021-12-08 MED ORDER — FENTANYL CITRATE (PF) 250 MCG/5ML IJ SOLN
INTRAMUSCULAR | Status: DC | PRN
  Administered 2021-12-08: 50 ug via INTRAVENOUS
  Administered 2021-12-08: 100 ug via INTRAVENOUS
  Administered 2021-12-08 (×2): 50 ug via INTRAVENOUS

## 2021-12-08 MED ORDER — BUPIVACAINE HCL (PF) 0.25 % IJ SOLN
INTRAMUSCULAR | Status: DC | PRN
  Administered 2021-12-08: 19 mL

## 2021-12-08 MED ORDER — ROCURONIUM BROMIDE 10 MG/ML (PF) SYRINGE
PREFILLED_SYRINGE | INTRAVENOUS | Status: AC
  Filled 2021-12-08: qty 10

## 2021-12-08 MED ORDER — KETOROLAC TROMETHAMINE 30 MG/ML IJ SOLN
30.0000 mg | Freq: Once | INTRAMUSCULAR | Status: AC
  Administered 2021-12-08: 30 mg via INTRAVENOUS

## 2021-12-08 MED ORDER — POVIDONE-IODINE 10 % EX SWAB
2.0000 "application " | Freq: Once | CUTANEOUS | Status: AC
  Administered 2021-12-08: 2 via TOPICAL

## 2021-12-08 MED ORDER — PROPOFOL 10 MG/ML IV BOLUS
INTRAVENOUS | Status: DC | PRN
  Administered 2021-12-08: 150 mg via INTRAVENOUS

## 2021-12-08 MED ORDER — CELECOXIB 200 MG PO CAPS
200.0000 mg | ORAL_CAPSULE | Freq: Once | ORAL | Status: AC
  Administered 2021-12-08: 200 mg via ORAL
  Filled 2021-12-08: qty 1

## 2021-12-08 MED ORDER — FENTANYL CITRATE (PF) 100 MCG/2ML IJ SOLN: 25.0000 ug | INTRAMUSCULAR | Status: DC | PRN

## 2021-12-08 MED ORDER — ONDANSETRON HCL 4 MG/2ML IJ SOLN
INTRAMUSCULAR | Status: AC
  Filled 2021-12-08: qty 2

## 2021-12-08 MED ORDER — DEXAMETHASONE SODIUM PHOSPHATE 10 MG/ML IJ SOLN
INTRAMUSCULAR | Status: AC
  Filled 2021-12-08: qty 1

## 2021-12-08 MED ORDER — LIDOCAINE 2% (20 MG/ML) 5 ML SYRINGE
INTRAMUSCULAR | Status: DC | PRN
Start: 2021-12-08 — End: 2021-12-08
  Administered 2021-12-08: 80 mg via INTRAVENOUS

## 2021-12-08 SURGICAL SUPPLY — 31 items
BAG RETRIEVAL 10 (BASKET) ×1
BIPOLAR CUTTING LOOP 21FR (ELECTRODE)
CATH ROBINSON RED A/P 16FR (CATHETERS) ×4 IMPLANT
DEVICE MYOSURE LITE (MISCELLANEOUS) IMPLANT
DEVICE MYOSURE REACH (MISCELLANEOUS) IMPLANT
DILATOR CANAL MILEX (MISCELLANEOUS) IMPLANT
DRSG TELFA 3X8 NADH (GAUZE/BANDAGES/DRESSINGS) ×4 IMPLANT
GAUZE 4X4 16PLY ~~LOC~~+RFID DBL (SPONGE) ×8 IMPLANT
GLOVE SURG LTX SZ6.5 (GLOVE) ×8 IMPLANT
GLOVE SURG UNDER POLY LF SZ7 (GLOVE) ×4 IMPLANT
GOWN STRL REUS W/TWL LRG LVL3 (GOWN DISPOSABLE) ×8 IMPLANT
IV NS IRRIG 3000ML ARTHROMATIC (IV SOLUTION) ×4 IMPLANT
KIT PROCEDURE FLUENT (KITS) ×4 IMPLANT
KIT TURNOVER CYSTO (KITS) ×4 IMPLANT
LIGASURE VESSEL 5MM BLUNT TIP (ELECTROSURGICAL) ×2 IMPLANT
LOOP CUTTING BIPOLAR 21FR (ELECTRODE) IMPLANT
NDL INSUFFLATION 14GA 120MM (NEEDLE) IMPLANT
NEEDLE INSUFFLATION 14GA 120MM (NEEDLE) ×4 IMPLANT
PACK VAGINAL MINOR WOMEN LF (CUSTOM PROCEDURE TRAY) ×4 IMPLANT
PAD DRESSING TELFA 3X8 NADH (GAUZE/BANDAGES/DRESSINGS) ×2 IMPLANT
PAD OB MATERNITY 4.3X12.25 (PERSONAL CARE ITEMS) ×4 IMPLANT
SEAL CERVICAL OMNI LOK (ABLATOR) IMPLANT
SEAL ROD LENS SCOPE MYOSURE (ABLATOR) ×4 IMPLANT
SYS BAG RETRIEVAL 10MM (BASKET) ×3
SYSTEM BAG RETRIEVAL 10MM (BASKET) ×1 IMPLANT
SYSTEM CARTER THOMASON II (TROCAR) ×2 IMPLANT
TOWEL OR 17X26 10 PK STRL BLUE (TOWEL DISPOSABLE) ×4 IMPLANT
TROCAR ADV FIXATION 5X100MM (TROCAR) ×2 IMPLANT
TROCAR XCEL NON-BLD 11X100MML (ENDOMECHANICALS) ×2 IMPLANT
TROCAR XCEL NON-BLD 5MMX100MML (ENDOMECHANICALS) ×2 IMPLANT
WATER STERILE IRR 500ML POUR (IV SOLUTION) ×4 IMPLANT

## 2021-12-08 NOTE — Op Note (Addendum)
12/08/2021  8:45 AM  PATIENT:  Jill Roman  56 y.o. female  PRE-OPERATIVE DIAGNOSIS:  Thickened endometrium, h/o irregular bleeding, left ovarian mass  POST-OPERATIVE DIAGNOSIS:  Thickened endometrium, irregular bleeding, left ovarian mass  PROCEDURE:  Procedure(s): LAPAROSCOPIC LEFT OOPHORECTOMY WITH BILATERAL SALPINGECTOMY HYSTEROSCOPY WITH DILATATION & CURETTAGE  SURGEON:  Jerene Bears  ASSISTANTS: Dr. Perry Bing.  An experienced assistant was required given the standard of surgical care given the complexity of the case.  This assistant was needed for exposure, dissection, suctioning, retraction, instrument exchange and for overall help during the procedure.  RNFA help was also unavailable.  ANESTHESIA:   general  ESTIMATED BLOOD LOSS: 10 mL  BLOOD ADMINISTERED:none   FLUIDS: 800cc LR  UOP: 50cc   SPECIMEN:  left fallopian tube and ovary and right fallopian tubes, endometrial curetting  DISPOSITION OF SPECIMEN:  PATHOLOGY  FINDINGS: enlarged left ovary, normal uterus and normal fallopian tubes, normal upper abdomen.  Endometrial cavity was thin and atrophic.  No lesions noted.  DESCRIPTION OF OPERATION: Patient is taken to the operating room. She is placed in the supine position. She is a running IV in place. Informed consent was present on the chart. SCDs on her lower extremities and functioning properly. Patient was positioned while she was awake.  Her legs were placed in the low lithotomy position in Addy stirrups. Her arms were tucked by the side.  General endotracheal anesthesia was administered by the anesthesia staff without difficulty. Dr. Tacy Dura, anesthesia, oversaw case.  Time out performed.    Chlora prep was then used to prep the abdomen and Betadine was used to prep the inner thighs, perineum and vagina. Once 3 minutes had past the patient was draped in a normal standard fashion. The legs were lifted to the high lithotomy position. The cervix was visualized  by placing a heavy weighted speculum in the posterior aspect of the vagina and using a curved Deaver retractor to the retract anteriorly. The anterior lip of the cervix was grasped with single-tooth tenaculum.  The cervix sounded to 6 cm. A hulka clamp was attached to the anterior lip of the cervix and the tenaculum was removed.  Speculum was removed.  Foley was placed to straight drain.  Clear urine was noted. Legs were lowered to the low lithotomy position and attention was turned the abdomen.  The umbilicus was everted.  A Veress needle was obtained. Syringe of sterile saline was placed on a open Veress needle.  This was passed into the umbilicus until just when the fluid started to drip.  Then low flow CO2 gas was attached the needle and the pneumoperitoneum was achieved without difficulty. Once four liters of gas was in the abdomen the Veress needle was removed and a 5 millimeter non-bladed Optiview trocar and port were passed directly to the abdomen. The laparoscope was then used to confirm intraperitoneal placement. Findings noted above.  Locations for RLQ and LLQ ports were noted by transillumination of the abdominal wall.  0.25% marcaine was used to anesthetize the skin.  10mm skin incision was made in the RLQ and and 10mm port was placed under direct visualization.  Then a 5mm skin incision was made and a 5mm nonbladed trochar and port was placed in the LLQ.  All trochars were removed.    Ureters were identifies.  Attention was turned to the left side. With uterus on stretch the left IP ligament was transected with the ligasure apparatus.  Then the mesosalpinx was dissected to free  the tube. Then the left utero-ovarian pedicle was serially clamped cauterized and incised using the ligasure device.   Attention was turned the right side.  The uterus was placed on stretch to the opposite side.  The tube was excised off the ovary using sharp dissection a bipolar cautery.  The mesosalpinx was incised  freeing the tube. The tube was brought out.    Then an endocatch bag was placed and the left tube and ovary were placed in it. This was brought to the skin level and the ovary and tube taken out in smaller portions.  The bag stayed intact and there was no spillage of contents in the abdomen.    Then the LLQ incision was closed using the Carter-Thommason closure device.  Excellent fascial closure was noted.    At this point, CO2 pressures were lowered to 8mm Hg.  No bleeding was noted.  Ureters were noted deep in the pelvis to be peristalsing.  The remaining instruments were removed.  The ports (except the umbillical port) were removed under direct visualization of the laparoscope and the pneumoperitoneum was relieved.  The patient was taken out of Trendelenburg positioning.  Several deep breaths were given to the patient's trying to any gas the abdomen and finally the umbilical port was removed.  The skin was then closed with subcuticular stitches of 3-0 Vicryl. The skin was cleansed Dermabond was applied.   Attention was turned to the vagina.  The legs were lifted to the high lithotomy position.  The hulka clamp was removed.  Anterior lip of the cervix was grasped with a single toothed tenaculum.  Cervix dilated to #21 [facility] dilators.  A 2.9 millimeter diagnostic hysteroscope was obtained. Normal saline was used as a hysteroscopic fluid. The hysteroscope was advanced through the endocervical canal into the endometrial cavity. The tubal ostia were noted bilaterally. Endometrium was thin.  No lesions were noted.  .  The hysteroscope was removed. A #1 toothed curette was used to curette the cavity until rough gritty texture was noted in all quadrants.  The hysteroscope was removed. The fluid deficit was 50 cc. The tenaculum was removed from the anterior lip of the cervix. The speculum was removed from the vagina. The prep was cleansed of the patient's skin. The legs are positioned back in the supine  position. Sponge, lap, needle, initially counts were correct x2. Patient was taken to recovery in stable condition.  COUNTS:  YES  PLAN OF CARE: Transfer to PACU

## 2021-12-08 NOTE — Transfer of Care (Signed)
Immediate Anesthesia Transfer of Care Note  Patient: Jill Roman  Procedure(s) Performed: LAPAROSCOPIC OOPHORECTOMY with bilateral salpingectomy (Left: Abdomen) DILATATION AND CURETTAGE /HYSTEROSCOPY (Uterus)  Patient Location: PACU  Anesthesia Type:General  Level of Consciousness: awake, alert  and oriented  Airway & Oxygen Therapy: Patient Spontanous Breathing and Patient connected to face mask oxygen  Post-op Assessment: Report given to RN, Post -op Vital signs reviewed and stable, Patient moving all extremities X 4 and Patient able to stick tongue midline  Post vital signs: Reviewed  Last Vitals:  Vitals Value Taken Time  BP 140/71 12/08/21 0901  Temp 97.7   Pulse 73 12/08/21 0901  Resp 14 12/08/21 0901  SpO2 100 % 12/08/21 0901  Vitals shown include unvalidated device data.  Last Pain:  Vitals:   12/08/21 0616  TempSrc: Oral  PainSc:          Complications: No notable events documented.

## 2021-12-08 NOTE — Discharge Instructions (Signed)
Post-surgical Instructions, Outpatient Surgery  You may expect to feel dizzy, weak, and drowsy for as long as 24 hours after receiving the medicine that made you sleep (anesthetic). For the first 24 hours after your surgery:   Do not drive a car, ride a bicycle, participate in physical activities, or take public transportation until you are done taking narcotic pain medicines or as directed by Dr. Sabra Heck.  Do not drink alcohol or take tranquilizers.  Do not take medicine that has not been prescribed by your physicians.  Do not sign important papers or make important decisions while on narcotic pain medicines.  Have a responsible person with you.   CARE OF INCISION If you have a bandage, you may remove it in one day.  If there are steri-strips or dermabond, just let this loosen on its own.  You may shower on the first day after your surgery.  Do not sit in a tub bath for one week. Avoid heavy lifting (more than 10 pounds/4.5 kilograms), pushing, or pulling.  Avoid activities that may risk injury to your incisions.   PAIN MANAGEMENT Motrin 84m.  (This is the same as 4-2037mover the counter tablets of Motrin or ibuprofen.)  You may take this every eight hours or as needed for cramping.   Vicodin 5/32512m For more severe pain, take one or two tablets every four to six hours as needed for pain control.  (Remember that narcotic pain medications increase your risk of constipation.  If this becomes a problem, you may take an over the counter stool softener like Colace 100m59m to four times a day.)  DO'S AND DON'T'S Do not take a tub bath for one week.  You may shower on the first day after your surgery Do not do any heavy lifting for one to two weeks.  This increases the chance of bleeding. Do move around as you feel able.  Stairs are fine.  You may begin to exercise again as you feel able.  Do not lift any weights for two weeks. Do not put anything in the vagina for two weeks--no tampons,  intercourse, or douching.    REGULAR MEDIATIONS/VITAMINS: You may restart all of your regular medications as prescribed. You may restart all of your vitamins as you normally take them.    PLEASE CALL OR SEEK MEDICAL CARE IF: You have persistent nausea and vomiting.  You have trouble eating or drinking.  You have an oral temperature above 100.5.  You have constipation that is not helped by adjusting diet or increasing fluid intake. Pain medicines are a common cause of constipation.  You have heavy vaginal bleeding You have redness or drainage from your incision(s) or there is increasing pain or tenderness near or in the surgical site.

## 2021-12-08 NOTE — Anesthesia Procedure Notes (Signed)
Procedure Name: Intubation Date/Time: 12/08/2021 7:28 AM Performed by: Maude Leriche, CRNA Pre-anesthesia Checklist: Patient identified, Emergency Drugs available, Suction available and Patient being monitored Patient Re-evaluated:Patient Re-evaluated prior to induction Oxygen Delivery Method: Circle system utilized Preoxygenation: Pre-oxygenation with 100% oxygen Induction Type: IV induction Ventilation: Mask ventilation without difficulty Laryngoscope Size: Mac and 3 Grade View: Grade I Tube type: Oral Tube size: 7.0 mm Number of attempts: 1 Airway Equipment and Method: Stylet and Bite block Placement Confirmation: ETT inserted through vocal cords under direct vision, positive ETCO2 and breath sounds checked- equal and bilateral Secured at: 21 cm Tube secured with: Tape Dental Injury: Teeth and Oropharynx as per pre-operative assessment  Comments: Performed by Cherlynn Kaiser, SRNA.  Direct supervision by Anesthesiologist

## 2021-12-08 NOTE — H&P (Signed)
Jill Roman is an 56 y.o. female here for laparoscopic left cystectomy and left salpingectomy with possible LSO, right salpingectomy possible RSO and hysteroscopy with D&C and possible polyp resection due to complex left adnexal mass and thickened endometrium.    Risks, benefits and alternatives (which is just conservative management) have all been discussed.  This is documented in office notes.  Questions answered.  She is here and ready to proceed.  Pertinent Gynecological History: Menses: perimenopausal Contraception: abstinence DES exposure: denies Blood transfusions: none Sexually transmitted diseases: no past history Previous GYN Procedures:  none   Last mammogram: normal Date: 04/04/2021 Last pap: normal Date: 01/2020 OB History: G0, P0   Menstrual History: Patient's last menstrual period was 08/27/2021 (approximate).    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[place])    followed by dr Collene Mares;  per pt dx 8032   Complication of anesthesia    Diverticular disease    Fever    09-10-2021  per pt started body aches/ fever/ headache 09-07-2021 night fever 102 and stated today fever 101 at highest but  denies sob, congestion, gi symptoms;  stated had televisit with her pcp today and had negative covid/ flu test , told some type of virus and take tylenol   History of deep venous thrombosis 2010   left upper extremity,  completed blood thinner treatment  (09-10-2021 pt stated no clot before and none since 2010)   Hypertension    PONV (postoperative nausea and vomiting)    Thickened endometrium    Thoracic outlet syndrome 2010   left sided venous thrombosis;  s/p left first rib resection 01-23-2009 @[facility]    Wears glasses     Past Surgical History:  Procedure Laterality Date   COLONOSCOPY  08/26/2021   last one per pt   RIB RESECTION Left 12/30/2008   @[facility] ;  transaxillary first left rib due to thoracic  outlet syndrome    Family History  Problem Relation Age of Onset   Heart disease Mother    Anxiety disorder Mother    Depression Mother        severe depression   Heart disease Father        chf   Hypertension Father    Heart attack Father    Breast cancer Maternal Grandmother        age 68's   Heart attack Maternal Grandmother     Social History:  reports that she has been smoking cigarettes. She has never used smokeless tobacco. She reports that she does not currently use alcohol. She reports that she does not use drugs.  Allergies: No Known Allergies  Medications Prior to Admission  Medication Sig Dispense Refill Last Dose   acetaminophen (TYLENOL) 500 MG tablet Take 1,000 mg by mouth every 6 (six) hours as needed for moderate pain.   Past Month   Ascorbic Acid (VITAMIN C WITH ROSE HIPS) 1000 MG tablet Take 1,000 mg by mouth daily.   12/07/2021   azelastine (ASTELIN) 0.1 % nasal spray Place into both nostrils 2 (two) times daily as needed for rhinitis. Use in each nostril as directed   Past Week   cetirizine (ZYRTEC) 10 MG tablet Take 10 mg by mouth daily.   12/08/2021 at 0415   Cholecalciferol (VITAMIN D PO) Take 1,000 Units by mouth daily.   12/07/2021   mesalamine (CANASA) 1000 MG suppository Place 1,000 mg rectally  at bedtime.   12/07/2021   mesalamine (LIALDA) 1.2 g EC tablet Take 2.4 g by mouth daily.   12/07/2021   Multiple Vitamin (MULTIVITAMIN WITH MINERALS) TABS tablet Take 1 tablet by mouth daily.   12/07/2021   perindopril (ACEON) 8 MG tablet Take 4 mg by mouth 2 (two) times daily.   12/07/2021   zinc gluconate 50 MG tablet Take 50 mg by mouth daily.   12/07/2021   methocarbamol (ROBAXIN) 500 MG tablet Take 1 tablet (500 mg total) by mouth 2 (two) times daily. (Patient not taking: Reported on 12/04/2021) 20 tablet 0 Not Taking    Review of Systems  Blood pressure (!) 152/94, pulse 78, temperature 97.8 F (36.6 C), temperature source Oral, resp. rate 18, height 5' 5.5"  (1.664 m), weight 72.6 kg, last menstrual period 08/27/2021, SpO2 100 %. Physical Exam Constitutional:      Appearance: Normal appearance.  Cardiovascular:     Rate and Rhythm: Normal rate and regular rhythm.  Pulmonary:     Effort: Pulmonary effort is normal.     Breath sounds: Normal breath sounds.  Musculoskeletal:        General: Normal range of motion.  Neurological:     General: No focal deficit present.     Mental Status: She is alert.  Psychiatric:        Mood and Affect: Mood normal.    Results for orders placed or performed during the hospital encounter of 12/08/21 (from the past 24 hour(s))  CBC     Status: None   Collection Time: 12/08/21  5:44 AM  Result Value Ref Range   WBC 7.4 4.0 - 10.5 K/uL   RBC 4.74 3.87 - 5.11 MIL/uL   Hemoglobin 14.1 12.0 - 15.0 g/dL   HCT 42.4 36.0 - 46.0 %   MCV 89.5 80.0 - 100.0 fL   MCH 29.7 26.0 - 34.0 pg   MCHC 33.3 30.0 - 36.0 g/dL   RDW 13.5 11.5 - 15.5 %   Platelets 299 150 - 400 K/uL   nRBC 0.0 0.0 - 0.2 %  Basic metabolic panel per protocol     Status: None   Collection Time: 12/08/21  5:44 AM  Result Value Ref Range   Sodium 136 135 - 145 mmol/L   Potassium 3.9 3.5 - 5.1 mmol/L   Chloride 101 98 - 111 mmol/L   CO2 25 22 - 32 mmol/L   Glucose, Bld 97 70 - 99 mg/dL   BUN 10 6 - 20 mg/dL   Creatinine, Ser 0.78 0.44 - 1.00 mg/dL   Calcium 9.4 8.9 - 10.3 mg/dL   GFR, Estimated >60 >60 mL/min   Anion gap 10 5 - 15  Type and screen     Status: None (Preliminary result)   Collection Time: 12/08/21  6:00 AM  Result Value Ref Range   ABO/RH(D) PENDING    Antibody Screen PENDING    Sample Expiration      12/11/2021,2359 Performed at [facility], [place] [address], [place], [place] [address]   ABO/Rh     Status: None (Preliminary result)   Collection Time: 12/08/21  6:08 AM  Result Value Ref Range   ABO/RH(D) PENDING   Pregnancy, urine POC     Status: None   Collection Time: 12/08/21  6:25 AM  Result Value Ref  Range   Preg Test, Ur NEGATIVE NEGATIVE    No results found.  Assessment/Plan: 56 yo G0 SWF with complex left ovarian  cyst c/w dermoid here for left ovarian cystectomy with salpingectomy vs LSO, right salpingectomy, possible RSO and then hysteroscopy with D&C and possible polyp resection.  Risks, benefits alternatives have been reviewed.  Pt is here and ready to proceed.  Megan Salon 12/08/2021, 7:02 AM

## 2021-12-09 ENCOUNTER — Encounter (HOSPITAL_COMMUNITY): Payer: Self-pay | Admitting: Obstetrics & Gynecology

## 2021-12-09 LAB — SURGICAL PATHOLOGY

## 2021-12-09 NOTE — Anesthesia Postprocedure Evaluation (Signed)
Anesthesia Post Note ? ?Patient: Jill Roman ? ?Procedure(s) Performed: LAPAROSCOPIC OOPHORECTOMY with bilateral salpingectomy (Left: Abdomen) ?DILATATION AND CURETTAGE /HYSTEROSCOPY (Uterus) ? ?  ? ?Patient location during evaluation: PACU ?Anesthesia Type: General ?Level of consciousness: awake and alert ?Pain management: pain level controlled ?Vital Signs Assessment: post-procedure vital signs reviewed and stable ?Respiratory status: spontaneous breathing, nonlabored ventilation, respiratory function stable and patient connected to nasal cannula oxygen ?Cardiovascular status: blood pressure returned to baseline and stable ?Postop Assessment: no apparent nausea or vomiting ?Anesthetic complications: no ? ? ?No notable events documented. ? ?Last Vitals:  ?Vitals:  ? 12/08/21 0930 12/08/21 0945  ?BP: (!) 167/84 (!) 158/78  ?Pulse: 80 73  ?Resp: 15 15  ?Temp:  36.5 ?C  ?SpO2: 96% 96%  ?  ?Last Pain:  ?Vitals:  ? 12/08/21 0930  ?TempSrc:   ?PainSc: 3   ? ?Pain Goal: Patients Stated Pain Goal: 3 (12/08/21 0930) ? ?  ?  ?  ?  ?  ?  ?  ? ?[name] ? ? ? ? ?

## 2021-12-28 DIAGNOSIS — R935 Abnormal findings on diagnostic imaging of other abdominal regions, including retroperitoneum: Secondary | ICD-10-CM

## 2021-12-28 DIAGNOSIS — D279 Benign neoplasm of unspecified ovary: Secondary | ICD-10-CM

## 2021-12-28 DIAGNOSIS — N838 Other noninflammatory disorders of ovary, fallopian tube and broad ligament: Secondary | ICD-10-CM

## 2022-01-08 ENCOUNTER — Encounter (HOSPITAL_BASED_OUTPATIENT_CLINIC_OR_DEPARTMENT_OTHER): Payer: Self-pay | Admitting: Obstetrics & Gynecology

## 2022-01-08 ENCOUNTER — Ambulatory Visit (INDEPENDENT_AMBULATORY_CARE_PROVIDER_SITE_OTHER): Payer: BC Managed Care – PPO | Admitting: Obstetrics & Gynecology

## 2022-01-08 VITALS — Ht 66.5 in | Wt 169.4 lb

## 2022-01-08 DIAGNOSIS — Z9889 Other specified postprocedural states: Secondary | ICD-10-CM

## 2022-01-08 DIAGNOSIS — D279 Benign neoplasm of unspecified ovary: Secondary | ICD-10-CM

## 2022-01-10 NOTE — Progress Notes (Signed)
GYNECOLOGY  VISIT ? ?CC:   post op recheck ? ?HPI: ?56 y.o. G0P0000 Single White or Caucasian female here for recheck after undergoing LSO, right salpingectomy, and hysteroscopy on 2/28.  She reports bleeding is none.  She has no pain.  Bowel function is Normal.  Bladder function is normal.  Reports she's done really well and is very pleased. ? ?Pathology reviewed:  Yes .  Questions answered.   ? ?MEDS:   ?Current Outpatient Medications on File Prior to Visit  ?Medication Sig Dispense Refill  ? acetaminophen (TYLENOL) 500 MG tablet Take 1,000 mg by mouth every 6 (six) hours as needed for moderate pain.    ? amLODipine (NORVASC) 2.5 MG tablet Take 2.5 mg by mouth at bedtime.    ? Ascorbic Acid (VITAMIN C WITH ROSE HIPS) 1000 MG tablet Take 1,000 mg by mouth daily.    ? azelastine (ASTELIN) 0.1 % nasal spray Place into both nostrils 2 (two) times daily as needed for rhinitis. Use in each nostril as directed    ? cetirizine (ZYRTEC) 10 MG tablet Take 10 mg by mouth daily.    ? Cholecalciferol (VITAMIN D PO) Take 1,000 Units by mouth daily.    ? mesalamine (CANASA) 1000 MG suppository Place 1,000 mg rectally at bedtime.    ? mesalamine (LIALDA) 1.2 g EC tablet Take 2.4 g by mouth daily.    ? Multiple Vitamin (MULTIVITAMIN WITH MINERALS) TABS tablet Take 1 tablet by mouth daily.    ? perindopril (ACEON) 8 MG tablet Take 4 mg by mouth 2 (two) times daily.    ? zinc gluconate 50 MG tablet Take 50 mg by mouth daily.    ? ?No current facility-administered medications on file prior to visit.  ? ? ?SH:  Smoking No  ? ? ?PHYSICAL EXAMINATION:   ? ?Ht 5' 6.5" (1.689 m)   Wt 169 lb 6.4 oz (76.8 kg)   BMI 26.93 kg/m?     ?General appearance: alert, cooperative and appears stated age ?Abdomen: soft, non-tender; bowel sounds normal; no masses,  no organomegaly ?Incisions:  C/D/I ? ? ?Assessment/Plan: ?1. Post-operative state ?- released to return to all normal activity ?- pt will return for routine exam that is already  scheduled ? ? ?

## 2022-05-13 ENCOUNTER — Other Ambulatory Visit: Payer: Self-pay | Admitting: Obstetrics & Gynecology

## 2022-05-13 DIAGNOSIS — Z1231 Encounter for screening mammogram for malignant neoplasm of breast: Secondary | ICD-10-CM

## 2022-05-20 ENCOUNTER — Ambulatory Visit
Admission: RE | Admit: 2022-05-20 | Discharge: 2022-05-20 | Disposition: A | Discharge status: Home / Self Care | Payer: BC Managed Care – PPO | Source: Ambulatory Visit | Attending: Obstetrics & Gynecology | Admitting: Obstetrics & Gynecology

## 2022-05-20 DIAGNOSIS — Z1231 Encounter for screening mammogram for malignant neoplasm of breast: Secondary | ICD-10-CM

## 2022-06-22 IMAGING — DX DG CHEST 1V PORT
1 series · 1 of 1 positions shown · non-contrast
Comparison: 08/29/2008

CLINICAL DATA: Fever

EXAM:
PORTABLE CHEST 1 VIEW

[chest ap]
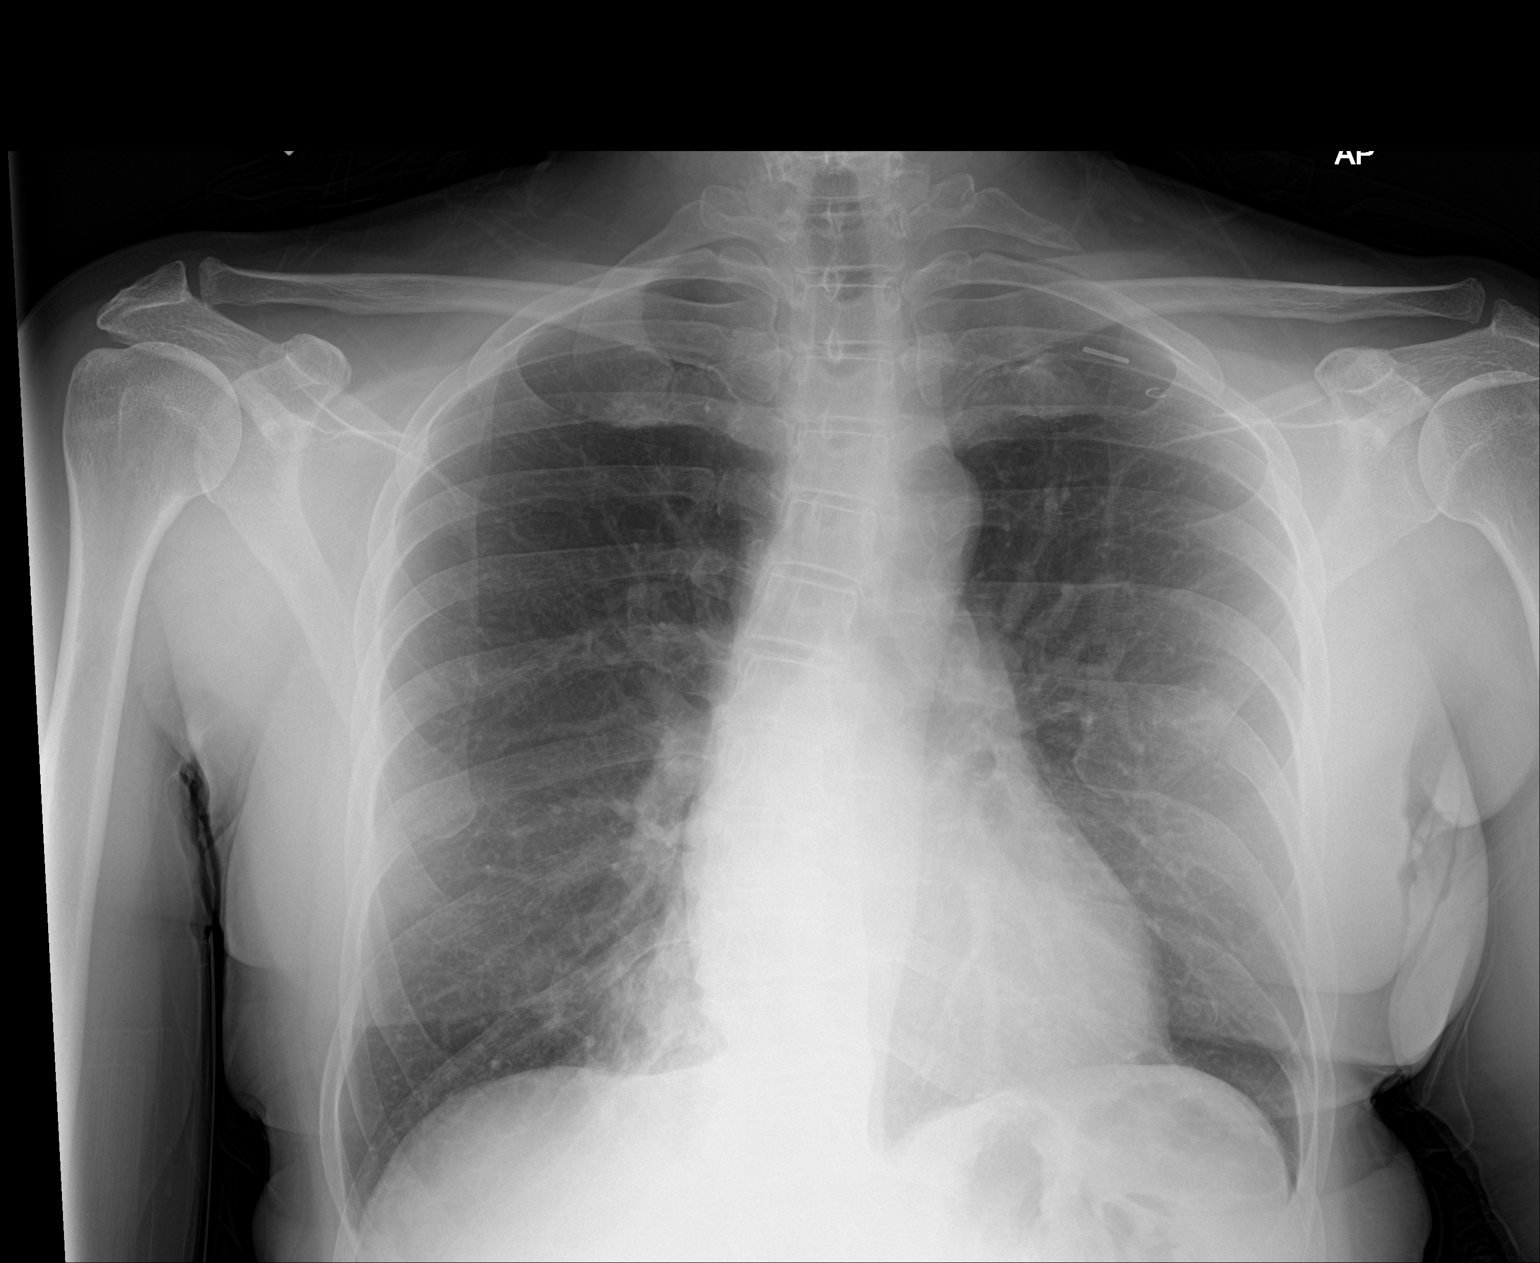

[1 of 1 positions shown; findings below may reference images not displayed]

FINDINGS: The heart size and mediastinal contours are within normal limits.
Both lungs are clear. The visualized skeletal structures are
unremarkable.
IMPRESSION: No acute abnormality of the lungs in AP portable projection.

## 2023-01-24 ENCOUNTER — Other Ambulatory Visit: Payer: Self-pay | Admitting: Obstetrics & Gynecology

## 2023-01-24 DIAGNOSIS — Z1231 Encounter for screening mammogram for malignant neoplasm of breast: Secondary | ICD-10-CM

## 2023-01-28 ENCOUNTER — Ambulatory Visit (HOSPITAL_BASED_OUTPATIENT_CLINIC_OR_DEPARTMENT_OTHER): Payer: BC Managed Care – PPO | Admitting: Obstetrics & Gynecology

## 2023-05-23 ENCOUNTER — Ambulatory Visit
Admission: RE | Admit: 2023-05-23 | Discharge: 2023-05-23 | Disposition: A | Discharge status: Home / Self Care | Payer: BC Managed Care – PPO | Source: Ambulatory Visit | Attending: Obstetrics & Gynecology | Admitting: Obstetrics & Gynecology

## 2023-05-23 DIAGNOSIS — Z1231 Encounter for screening mammogram for malignant neoplasm of breast: Secondary | ICD-10-CM

## 2024-04-09 ENCOUNTER — Other Ambulatory Visit: Payer: Self-pay | Admitting: Obstetrics & Gynecology

## 2024-04-09 DIAGNOSIS — Z1231 Encounter for screening mammogram for malignant neoplasm of breast: Secondary | ICD-10-CM

## 2024-05-30 ENCOUNTER — Ambulatory Visit

## 2024-06-18 ENCOUNTER — Ambulatory Visit
Admission: RE | Admit: 2024-06-18 | Discharge: 2024-06-18 | Disposition: A | Discharge status: Home / Self Care | Source: Ambulatory Visit | Attending: Obstetrics & Gynecology | Admitting: Obstetrics & Gynecology

## 2024-06-18 DIAGNOSIS — Z1231 Encounter for screening mammogram for malignant neoplasm of breast: Secondary | ICD-10-CM
